# Patient Record
Sex: Male | Born: 1965 | Race: White | Hispanic: No | Marital: Married | Smoking: Never smoker
Health system: Southern US, Community
[De-identification: ages and names within clinical notes are randomized; demographics above are authoritative.]

## PROBLEM LIST (undated history)

## (undated) DIAGNOSIS — E785 Hyperlipidemia, unspecified: Secondary | ICD-10-CM

## (undated) DIAGNOSIS — I10 Essential (primary) hypertension: Secondary | ICD-10-CM

## (undated) DIAGNOSIS — N200 Calculus of kidney: Secondary | ICD-10-CM

## (undated) HISTORY — PX: KIDNEY STONE SURGERY: SHX686

## (undated) HISTORY — PX: VASECTOMY: SHX75

## (undated) HISTORY — DX: Hyperlipidemia, unspecified: E78.5

## (undated) HISTORY — DX: Essential (primary) hypertension: I10

---

## 2003-04-25 ENCOUNTER — Ambulatory Visit (HOSPITAL_COMMUNITY): Admission: RE | Admit: 2003-04-25 | Discharge: 2003-04-25 | Payer: Self-pay | Admitting: Family Medicine

## 2003-04-25 ENCOUNTER — Encounter: Payer: Self-pay | Admitting: Family Medicine

## 2003-05-17 ENCOUNTER — Ambulatory Visit (HOSPITAL_COMMUNITY): Admission: RE | Admit: 2003-05-17 | Discharge: 2003-05-17 | Payer: Self-pay | Admitting: Family Medicine

## 2003-05-17 ENCOUNTER — Encounter: Payer: Self-pay | Admitting: Family Medicine

## 2008-04-29 ENCOUNTER — Inpatient Hospital Stay (HOSPITAL_COMMUNITY): Admission: EM | Admit: 2008-04-29 | Discharge: 2008-05-02 | Payer: Self-pay | Admitting: Emergency Medicine

## 2009-11-09 ENCOUNTER — Emergency Department (HOSPITAL_COMMUNITY): Admission: EM | Admit: 2009-11-09 | Discharge: 2009-11-09 | Payer: Self-pay | Admitting: Emergency Medicine

## 2010-03-16 ENCOUNTER — Emergency Department (HOSPITAL_COMMUNITY): Admission: EM | Admit: 2010-03-16 | Discharge: 2010-03-17 | Payer: Self-pay | Admitting: Emergency Medicine

## 2010-03-17 ENCOUNTER — Emergency Department (HOSPITAL_COMMUNITY): Admission: EM | Admit: 2010-03-17 | Discharge: 2010-03-17 | Payer: Self-pay | Admitting: Emergency Medicine

## 2010-03-18 ENCOUNTER — Ambulatory Visit (HOSPITAL_BASED_OUTPATIENT_CLINIC_OR_DEPARTMENT_OTHER): Admission: RE | Admit: 2010-03-18 | Discharge: 2010-03-18 | Payer: Self-pay | Admitting: Urology

## 2011-03-04 LAB — URINALYSIS, ROUTINE W REFLEX MICROSCOPIC
Bilirubin Urine: NEGATIVE
Glucose, UA: 100 mg/dL — AB
Ketones, ur: NEGATIVE mg/dL
Nitrite: NEGATIVE
Specific Gravity, Urine: 1.02 (ref 1.005–1.030)
Urobilinogen, UA: 1 mg/dL (ref 0.0–1.0)
Urobilinogen, UA: 1 mg/dL (ref 0.0–1.0)
pH: 7 (ref 5.0–8.0)

## 2011-03-04 LAB — POCT HEMOGLOBIN-HEMACUE: Hemoglobin: 15 g/dL (ref 13.0–17.0)

## 2011-03-04 LAB — URINE MICROSCOPIC-ADD ON

## 2011-03-18 LAB — URINALYSIS, ROUTINE W REFLEX MICROSCOPIC
Glucose, UA: NEGATIVE mg/dL
Specific Gravity, Urine: <1.005 — ABNORMAL LOW (ref 1.005–1.030)
Urobilinogen, UA: 0.2 mg/dL (ref 0.0–1.0)
pH: 6 (ref 5.0–8.0)

## 2011-03-18 LAB — URINE MICROSCOPIC-ADD ON

## 2011-04-28 NOTE — Group Therapy Note (Signed)
NAME:  Howard Conner, Howard Conner NO.:  [id number]   MEDICAL RECORD NO.:  [id number]          PATIENT TYPE:  INP   LOCATION:  A339                          FACILITY:  [facility]   PHYSICIAN:  Skeet Latch, DO    DATE OF BIRTH:  02-02-1966   DATE OF PROCEDURE:  05/01/2008  DATE OF DISCHARGE:                                 PROGRESS NOTE   SUBJECTIVE:  [name] is a 45 year old Caucasian male who presented  with a 2-week history of allergy-type symptoms with itchy eyes and cough  that progressed into shortness of breath.  His primary care physician  gave him allergy pills but did not help.  The patient then returned and  was treated with antibiotics.  The patient states that this did not  relieve his symptoms.  He was also given an inhaler but this also did  not relieve his symptoms.  The patient states that his breathing got  progressively worse and he decided to come to the emergency room to be  evaluated.  The patient was seen here and received multiple breathing  treatments and p.o. steroids in the emergency room that did not relieve  his symptoms.  He was admitted for acute exacerbation of asthma and  placed on IV steroids and p.o. antibiotics.  Today the patient states he  is doing much better.  He will be weaned off high-dose steroids starting  tomorrow.  Overall he states that he is improving.   OBJECTIVE:  VITAL SIGNS:  Temperature is 98.0, pulse 82, respirations  20, blood pressure 132/75.  CARDIOVASCULAR:  Regular rate and rhythm.  No murmurs, rubs or gallops.  His lung seem more clear.  Slight end-expiratory wheezing, no rhonchi,  no rales.  ABDOMEN:  Soft, nontender, nondistended. No rigidity or guarding.  Positive bowel sounds.  EXTREMITIES:  No clubbing, cyanosis, or edema.   Labs:  Respiratory culture did show a few gram-negative rods, few gram-  positive cocci in pairs and clusters.  His white count is 20,100,  hemoglobin 13.9, hematocrit 39.0, platelet  count of 280.  He has a  phosphorus of 2.5.  Magnesium of 2.3.  His ABG performed on Apr 29, 2008, showed a pO2 of 59.3 and a bicarb of 25.8.   ASSESSMENT AND PLAN:  1. Acute exacerbation of asthma.  The patient will be weaned from high-      dose steroids starting tomorrow.  He could probably be switched      over to p.o. steroids late tomorrow evening.  He probably needs to      be continued on his p.o. Levaquin upon discharge.  Also probably      continue nebulizer treatments until he is discharged.  The patient      will probably need a PFT as an outpatient.  We will defer to Dr.      Phillips Odor for this.  2. The patient probably wants to home tomorrow.  This is depending on      how his weaning to lower-dose steroids is doing.  Anticipate the      patient  being discharged probably tomorrow evening or early      Thursday morning.      Skeet Latch, DO  Electronically Signed     SM/MEDQ  D:  05/01/2008  T:  05/01/2008  Job:  [id number]

## 2011-04-28 NOTE — Discharge Summary (Signed)
NAME:  Howard Conner, Howard Conner               ACCOUNT NO.:  [id number]   MEDICAL RECORD NO.:  [id number]          PATIENT TYPE:  INP   LOCATION:  A339                          FACILITY:  [facility]   PHYSICIAN:  Osvaldo Shipper, MD     DATE OF BIRTH:  December 17, 1965   DATE OF ADMISSION:  04/29/2008  DATE OF DISCHARGE:  05/20/2009LH                               DISCHARGE SUMMARY   Please review H&P dictated by Dr. Lilian Kapur for details regarding the  patient's presenting illness.   PMD:  Dr. Assunta Found with [facility].   DISCHARGE DIAGNOSES:  1. Likely acute asthma exacerbation.  2. History of seasonal allergies.  3. Two episodes of emesis unclear etiology.   BRIEF HOSPITAL COURSE:  Briefly, the patient is a 45 year old Caucasian  male who has really no other no medical history.  He does have a history  of seasonal allergies for which he takes as needed Allegra.  He also is  not a smoker.  He presented after a few weeks history of allergy-type  symptoms, and then about a few days prior to admission he started  feeling short of breath.  Went to his PMD's office and was treated with  antibiotics, felt no better, and decided to come into the emergency  department.  The patient did not feel better after he was treated in the  ED and he was admitted for further evaluation.  When he presented he did  have some wheezing bilaterally.  His chest x-ray did not show any focal  abnormality, bronchitic changes were noted.  The patient was treated  with nebulizer treatments, steroids.  He has improved the last couple of  days.  This morning his lungs are very clear.  He feels no discomfort  whatsoever.  He does admit to having a couple episodes of emesis  yesterday, but he has tolerated his breakfast today and he denies any  nausea or any abdominal pain.  At this point, the patient is stable for  discharge.   EXAM:  His vital signs are all stable.  Examination is unremarkable.  LUNGS: Clear to  auscultation.  No wheezing was present.   At this point I think the patient can be discharged just on p.r.n.  albuterol.  I will also write him prescriptions for steroid taper.  PFTs  will be arranged in 2 weeks.  However, the patient will be asked to  follow up with his PMD in one week's time.  This patient may be a  candidate for inhaled steroids, but I would defer to the PMD to initiate  that.  It will be prudent to wait until we have results from PFT, but if  the patient's symptoms recur, then that may be a sign that he does need  inhaled steroids.   Also an outpatient consultation with a pulmonologist may have to be  considered.   DISCHARGE MEDICATIONS:  1. Prednisone 60 mg daily for 3 days, 40 daily for 3 days, and then 20      daily for 3 days, and then stop.  2. Albuterol MDI 2 puffs  inhaled every 6 hours as needed.  3. Allegra 180 mg p.o. daily.   Follow up with Dr. Phillips Odor in 1 week and for pulmonary function tests in  2 weeks.   PMD to decide referral to pulmonologist if needed.   DIET:  No restrictions.   PHYSICAL ACTIVITY:  No restrictions.   Total time on this discharge encounter about 35 minutes.      Osvaldo Shipper, MD  Electronically Signed     GK/MEDQ  D:  05/02/2008  T:  05/02/2008  Job:  [id number]   cc:   [name], M.D.  Fax: [phone]

## 2011-04-28 NOTE — H&P (Signed)
NAME:  Howard Conner, Howard Conner               ACCOUNT NO.:  [id number]   MEDICAL RECORD NO.:  [id number]          PATIENT TYPE:  OBV   LOCATION:  A339                          FACILITY:  [facility]   PHYSICIAN:  Skeet Latch, DO    DATE OF BIRTH:  [birth year]   DATE OF ADMISSION:  04/29/2008  DATE OF DISCHARGE:  LH                              HISTORY & PHYSICAL   PRIMARY CARE PHYSICIAN:  Dr. Phillips Odor.   CHIEF COMPLAINT:  Shortness of breath.   HISTORY OF PRESENT ILLNESS:  This is a 45 year old Caucasian male who  was in his usual state of health until approximately 2 weeks ago when he  started getting allergy-type symptoms with sneezing, itching eyes, and  cough.  The patient states after those symptoms, he began to have some  severe coughing with some shortness of breath.  He went to his primary  care physician's office, was given I  believe allergy pills which did  not help.  He went back to his primary care physician's office and began  to be treated with antibiotics for bronchitis.  He has been on  antibiotics and I believe inhaler for the last 2 weeks and states that  he ran out of the medication 2 days ago and states that his breathing  has worsened, and he is severely short of breath.  The patient now  presents with shortness of breath and cough.  The patient was seen in  the emergency room, was given p.o. steroids and multiple breathing  treatments without relief, and now it is felt  the patient needs to be  admitted for possible asthma exacerbation.   PAST MEDICAL HISTORY:  Is unremarkable.   MEDICATIONS:  None.   DRUG ALLERGIES:  None.   PAST SURGICAL HISTORY:  None.   SOCIAL HISTORY:  Denies any tobacco, alcohol or illicit drug use.  The  patient works at [employer].   FAMILY HISTORY:  Is positive for cancer, lymphoma.   REVIEW OF SYSTEMS:  Unremarkable except for respiratory with shortness  of breath and cough.   PHYSICAL EXAMINATION:  VITAL SIGNS:  Temperature is 98.2,  pulse 101,  respirations 20, blood pressure 133/87, saturating 95%.  GENERAL:  He is awake and alert.  He does have conversational dyspnea on  exam. He is well nourished, well hydrated, well developed, no acute  distress.  CARDIOVASCULAR:  Slightly tachycardiac.  No rubs, gallops or murmurs.  LUNGS:  Decreased breath sounds with decreased effort, poor air exchange  noted.  He has some wheezing bilateral bases posteriorly.  No rhonchi  appreciated.  ABDOMEN:  Soft, nontender, nondistended.  No rigidity or guarding.  Positive bowel sounds.  EXTREMITIES:  No clubbing, cyanosis or edema.  HEENT:  Head is atraumatic, normocephalic.  PERRLA.  EOMI.  No scleral  icterus is noted.  NECK:  Soft, supple, nontender, nondistended.  NEUROLOGIC:  Cranial nerves II-XII are grossly intact.  Patient alert  and oriented x3.   LABORATORY DATA:  ABG showed a pH of 7.393, pCO2 of 43.3, pO2 of 59.3,  bicarb 25.8.   Chest x-ray showed  bronchitic change.  No focal pulmonary abnormalities   ASSESSMENT AND PLAN:  Asthma exacerbation.  For this, the patient will  be admitted to the service of [facility], general medical bed.  The  patient will receive nebulized treatments every 4 hours and as needed.  The patient will be on IV steroids also.  Will empirically place the  patient  on p.o. antibiotics and get sputum cultures and sensitivities.  Will try to keep the patient's O2 saturation above or greater than 90%.  The patient will be on IV fluids at a rate of 125 mL per hour.  The  patient will be on IV antiemetics as well as pain medications as needed.  The patient will also will also be on GI prophylaxis with proton pump  inhibitor.  If the patient does not improve within the next 24 hours,  may need to consult pulmonology for some recommendations.      Skeet Latch, DO  Electronically Signed     SM/MEDQ  D:  04/29/2008  T:  04/29/2008  Job:  [id number]   cc:   Dr.  Phillips Odor

## 2011-09-09 LAB — MAGNESIUM: Magnesium: 2.3

## 2011-09-09 LAB — BASIC METABOLIC PANEL
BUN: 11
CO2: 28
Calcium: 8.8
Chloride: 108
GFR calc Af Amer: >60
GFR calc non Af Amer: >60
Glucose, Bld: 190 — ABNORMAL HIGH
Potassium: 4
Sodium: 139

## 2011-09-09 LAB — CBC
HCT: 41.4
Hemoglobin: 13.9
MCHC: 35.7
MCV: 85.2
Platelets: 254
Platelets: 280
RBC: 4.5
RDW: 12.4
WBC: 20.1 — ABNORMAL HIGH

## 2011-09-09 LAB — BLOOD GAS, ARTERIAL
Bicarbonate: 25.8 — ABNORMAL HIGH
FIO2: 21
O2 Saturation: 90.3
Patient temperature: 37

## 2011-09-09 LAB — DIFFERENTIAL
Basophils Absolute: 0
Basophils Relative: 0
Eosinophils Absolute: 0
Eosinophils Absolute: 0
Eosinophils Relative: 0
Lymphocytes Relative: 5 — ABNORMAL LOW
Lymphocytes Relative: 6 — ABNORMAL LOW
Lymphs Abs: 0.9
Lymphs Abs: 1.1
Monocytes Absolute: 0.7
Monocytes Relative: 4
Neutro Abs: 15.7 — ABNORMAL HIGH
Neutrophils Relative %: 92 — ABNORMAL HIGH

## 2011-09-09 LAB — CULTURE, RESPIRATORY W GRAM STAIN: Culture: NORMAL

## 2014-06-19 ENCOUNTER — Encounter (HOSPITAL_COMMUNITY): Payer: Self-pay | Admitting: Emergency Medicine

## 2014-06-19 ENCOUNTER — Emergency Department (HOSPITAL_COMMUNITY)
Admission: EM | Admit: 2014-06-19 | Discharge: 2014-06-19 | Disposition: A | Discharge status: Home / Self Care | Payer: No Typology Code available for payment source | Attending: Emergency Medicine | Admitting: Emergency Medicine

## 2014-06-19 DIAGNOSIS — L03019 Cellulitis of unspecified finger: Principal | ICD-10-CM

## 2014-06-19 DIAGNOSIS — L03012 Cellulitis of left finger: Secondary | ICD-10-CM

## 2014-06-19 DIAGNOSIS — L02519 Cutaneous abscess of unspecified hand: Secondary | ICD-10-CM | POA: Insufficient documentation

## 2014-06-19 DIAGNOSIS — Z87442 Personal history of urinary calculi: Secondary | ICD-10-CM | POA: Insufficient documentation

## 2014-06-19 HISTORY — DX: Calculus of kidney: N20.0

## 2014-06-19 MED ORDER — LIDOCAINE HCL 1 % IJ SOLN: 5.0000 mL | Freq: Once | INTRAMUSCULAR | Status: DC

## 2014-06-19 MED ORDER — SULFAMETHOXAZOLE-TMP DS 800-160 MG PO TABS
1.0000 | ORAL_TABLET | Freq: Once | ORAL | Status: AC
  Administered 2014-06-19: 1 via ORAL
  Filled 2014-06-19: qty 1

## 2014-06-19 NOTE — ED Notes (Signed)
Pt noticed spot on left middle finger on Saturday, states every since then hand has been getting worse, painful, red and swollen. Pt went to PCP was but on Septra and give a prednisone shot on Monday.

## 2014-06-19 NOTE — Discharge Instructions (Signed)
Cellulitis Cellulitis is an infection of the skin and the tissue beneath it. The infected area is usually red and tender. Cellulitis occurs most often in the arms and lower legs.  CAUSES  Cellulitis is caused by bacteria that enter the skin through cracks or cuts in the skin. The most common types of bacteria that cause cellulitis are Staphylococcus and Streptococcus. SYMPTOMS   Redness and warmth.  Swelling.  Tenderness or pain.  Fever. DIAGNOSIS  Your caregiver can usually determine what is wrong based on a physical exam. Blood tests may also be done. TREATMENT  Treatment usually involves taking an antibiotic medicine. HOME CARE INSTRUCTIONS   Take your antibiotics as directed. Finish them even if you start to feel better.  Keep the infected arm or leg elevated to reduce swelling.  Apply a warm cloth to the affected area up to 4 times per day to relieve pain.  Only take over-the-counter or prescription medicines for pain, discomfort, or fever as directed by your caregiver.  Keep all follow-up appointments as directed by your caregiver. SEEK MEDICAL CARE IF:   You notice red streaks coming from the infected area.  Your red area gets larger or turns dark in color.  Your bone or joint underneath the infected area becomes painful after the skin has healed.  Your infection returns in the same area or another area.  You notice a swollen bump in the infected area.  You develop new symptoms. SEEK IMMEDIATE MEDICAL CARE IF:   You have a fever.  You feel very sleepy.  You develop vomiting or diarrhea.  You have a general ill feeling (malaise) with muscle aches and pains. MAKE SURE YOU:   Understand these instructions.  Will watch your condition.  Will get help right away if you are not doing well or get worse. Document Released: 09/09/2005 Document Revised: 05/31/2012 Document Reviewed: 02/15/2012 Exit[employer] Patient Information 2015 [employer]. This information is  not intended to replace advice given to you by your health care provider. Make sure you discuss any questions you have with your health care provider.  

## 2014-06-19 NOTE — ED Provider Notes (Signed)
Medical screening examination/treatment/procedure(s) were conducted as a shared visit with non-physician practitioner(s) and myself.  I personally evaluated the patient during the encounter.  Small pustule noted on dorsal aspect left middle finger (overlying PIP).  Full passive ROM.   Doubt tenosynovitis or severe deep space infection of hand. Do not feel that IV abx  And admission are necessary at this time.  Discussed importance of taking his antibiotics.  Will need close follow up.  24 hours.    Dorie Rank, MD 06/19/14 [phone]

## 2014-06-19 NOTE — ED Provider Notes (Signed)
CSN: [id number]     Arrival date & time 06/19/14  1452 History  This chart was scribed for non-physician practitioner Howard Conner, working with Howard Rank, MD by Howard Conner, ED Scribe. This patient was seen in room WTR7/WTR7 and the patient's care was started at 3:54 PM.    Chief Complaint  Patient presents with  . Hand Pain    Patient is a 48 y.o. male presenting with hand pain. The history is provided by the patient. No language interpreter was used.  Hand Pain   HPI Comments: Howard Conner is a 48 y.o. male who presents to the Emergency Department complaining of a painful, erythematous "spot" on his left middle finger that he noticed three days ago.  The patient states that he was able to drain some pus from the spot and two days ago he noticed increased swelling and pain.  The patient states that he went to his PCP yesterday and was given a shot of prednisone and a prescription for Septra and Bactrim.  He states that he did not fill the prescription for the Septra.  He states that this morning the pain, swelling, and erythema has worsened.  He states that he had a low-grade fever of 99 degrees yesterday.  He states that he is right hand dominant.  He denies having a history of DM.   Past Medical History  Diagnosis Date  . Kidney stone    History reviewed. No pertinent past surgical history. No family history on file. History  Substance Use Topics  . Smoking status: Never Smoker   . Smokeless tobacco: Not on file  . Alcohol Use: No    Review of Systems  Musculoskeletal: Positive for joint swelling.  Skin: Positive for color change and wound.  All other systems reviewed and are negative.     Allergies  Review of patient's allergies indicates no known allergies.  Home Medications   Prior to Admission medications   Medication Sig Start Date End Date Taking? Authorizing Provider  ibuprofen (ADVIL,MOTRIN) 200 MG tablet Take 600 mg by mouth every 6 (six) hours as needed  for moderate pain.   Yes Historical Provider, MD  PRESCRIPTION MEDICATION    Yes Historical Provider, MD   Triage Vitals: BP 155/109  Pulse 94  Temp(Src) 98.5 F (36.9 C) (Oral)  Resp 20  SpO2 94%  Physical Exam  Nursing note and vitals reviewed. Constitutional: He is oriented to person, place, and time. He appears well-developed and well-nourished. No distress.  HENT:  Head: Normocephalic and atraumatic.  Right Ear: External ear normal.  Left Ear: External ear normal.  Nose: Nose normal.  Eyes: Conjunctivae and EOM are normal.  Neck: Normal range of motion. No tracheal deviation present.  Cardiovascular: Normal rate, regular rhythm and normal heart sounds.   Pulmonary/Chest: Effort normal and breath sounds normal. No stridor.  Abdominal: Soft. He exhibits no distension. There is no tenderness.  Musculoskeletal: Normal range of motion.  Full rom of left middle finger. Compartment soft.   Neurological: He is alert and oriented to person, place, and time.  Skin: Skin is warm and dry. He is not diaphoretic. There is erythema.  2 cm of erythema with central pustule on dorsal aspect of left third finger.    Psychiatric: He has a normal mood and affect. His behavior is normal.    ED Course  Procedures (including critical care time)  DIAGNOSTIC STUDIES: Oxygen Saturation is 94% on room air, adequate by my interpretation.  COORDINATION OF CARE: 3:57 PM- Discussed consulting the attending physician to determine whether or not to start the patient on IV antibiotics or drain the wound.  The patient agreed to the treatment plan.   INCISION AND DRAINAGE Performed by: Howard Conner Consent: Verbal consent obtained. Risks and benefits: risks, benefits and alternatives were discussed Type: abscess  Body area: left middle finger  Anesthesia: digital block  Incision was made with a scalpel.  Local anesthetic: lidocaine 2%  Anesthetic total: 3 ml  Complexity: complex Blunt  dissection to break up loculations  Drainage: bloody  Drainage amount: moderate  Patient tolerance: Patient tolerated the procedure well with no immediate complications.     Labs Review Labs Reviewed - No data to display  Imaging Review No results found.   EKG Interpretation None      MDM   Final diagnoses:  Cellulitis of finger of left hand    Patient presents to ED with complaint of cellulitis of left middle finger. Seen by PCP and given septra, although this rx was not filled. I&D done in ED, drainage mostly bloody. Reassured patient that Bactrim was the appropriate antibiotic. He was given his first dose in ED. Patient was given hand surgery referral. Encouraged to see either PCP or return to ED in 1 day. Discussed reasons to return to ED immediately. Vital signs stable for discharge. Dr. Tomi Conner evaluated patient and agrees with plan. Patient / Family / Caregiver informed of clinical course, understand medical decision-making process, and agree with plan.    I personally performed the services described in this documentation, which was scribed in my presence. The recorded information has been reviewed and is accurate.     Howard Lade, PA-C 06/27/14 [place], PA-C 06/27/14 1314

## 2014-06-28 NOTE — ED Provider Notes (Signed)
Medical screening examination/treatment/procedure(s) were performed by non-physician practitioner and as supervising physician I was immediately available for consultation/collaboration.    Dorie Rank, MD 06/28/14 [phone]

## 2015-09-14 ENCOUNTER — Emergency Department (HOSPITAL_COMMUNITY)
Admission: EM | Admit: 2015-09-14 | Discharge: 2015-09-14 | Disposition: A | Discharge status: Home / Self Care | Payer: No Typology Code available for payment source | Attending: Emergency Medicine | Admitting: Emergency Medicine

## 2015-09-14 ENCOUNTER — Emergency Department (HOSPITAL_COMMUNITY): Payer: No Typology Code available for payment source

## 2015-09-14 ENCOUNTER — Encounter (HOSPITAL_COMMUNITY): Payer: Self-pay | Admitting: Emergency Medicine

## 2015-09-14 DIAGNOSIS — N200 Calculus of kidney: Secondary | ICD-10-CM | POA: Insufficient documentation

## 2015-09-14 DIAGNOSIS — R109 Unspecified abdominal pain: Secondary | ICD-10-CM | POA: Diagnosis present

## 2015-09-14 LAB — URINALYSIS, ROUTINE W REFLEX MICROSCOPIC
BILIRUBIN URINE: NEGATIVE
Glucose, UA: NEGATIVE mg/dL
Ketones, ur: NEGATIVE mg/dL
Leukocytes, UA: NEGATIVE
NITRITE: NEGATIVE
Protein, ur: NEGATIVE mg/dL
SPECIFIC GRAVITY, URINE: 1.015 (ref 1.005–1.030)
UROBILINOGEN UA: 0.2 mg/dL (ref 0.0–1.0)
pH: 7.5 (ref 5.0–8.0)

## 2015-09-14 LAB — URINE MICROSCOPIC-ADD ON

## 2015-09-14 MED ORDER — ONDANSETRON 8 MG PO TBDP: 8.0000 mg | ORAL_TABLET | Freq: Three times a day (TID) | ORAL | Status: DC | PRN

## 2015-09-14 MED ORDER — SODIUM CHLORIDE 0.9 % IV SOLN
INTRAVENOUS | Status: DC
  Administered 2015-09-14: 20 mL/h via INTRAVENOUS

## 2015-09-14 MED ORDER — KETOROLAC TROMETHAMINE 30 MG/ML IJ SOLN
30.0000 mg | Freq: Once | INTRAMUSCULAR | Status: AC
  Administered 2015-09-14: 30 mg via INTRAVENOUS
  Filled 2015-09-14: qty 1

## 2015-09-14 MED ORDER — OXYCODONE-ACETAMINOPHEN 5-325 MG PO TABS
2.0000 | ORAL_TABLET | ORAL | Status: DC | PRN
Start: 2015-09-14 — End: 2017-01-20

## 2015-09-14 MED ORDER — ONDANSETRON HCL 4 MG/2ML IJ SOLN
4.0000 mg | Freq: Once | INTRAMUSCULAR | Status: AC
  Administered 2015-09-14: 4 mg via INTRAVENOUS
  Filled 2015-09-14: qty 2

## 2015-09-14 MED ORDER — HYDROMORPHONE HCL 1 MG/ML IJ SOLN
1.0000 mg | Freq: Once | INTRAMUSCULAR | Status: AC
  Administered 2015-09-14: 1 mg via INTRAVENOUS
  Filled 2015-09-14: qty 1

## 2015-09-14 NOTE — ED Notes (Signed)
Pt from home c/o left lower back pain/flank pain since about midnight. Hx of kidney stones. Pt experiencing vomiting at home and unbale to keep down pain medications.

## 2015-09-14 NOTE — ED Provider Notes (Signed)
CSN: [id number]     Arrival date & time 09/14/15  0236 History  By signing my name below, I, Irene Pap, attest that this documentation has been prepared under the direction and in the presence of Lacretia Leigh, MD. Electronically Signed: Irene Pap, ED Scribe. 09/14/2015. 2:49 AM.   Chief Complaint  Patient presents with  . Flank Pain   The history is provided by the patient. No language interpreter was used.   HPI Comments: Howard Conner is a 49 y.o. Male with a hx of left kidney stone x1 who presents to the Emergency Department complaining of left flank pain onset 3 hours ago. Pt states this feels like his past kidney stone pain. Pt took oxycodone 325 mg to no relief because he continued to vomit up the medication. Wife states that pt was unable to pass previous stone and surgery was performed. Pt denies hematuria.   Past Medical History  Diagnosis Date  . Kidney stone    History reviewed. No pertinent past surgical history. No family history on file. Social History  Substance Use Topics  . Smoking status: Never Smoker   . Smokeless tobacco: None  . Alcohol Use: No    Review of Systems  Gastrointestinal: Positive for vomiting.  Genitourinary: Positive for flank pain. Negative for hematuria.  All other systems reviewed and are negative.  Allergies  Review of patient's allergies indicates no known allergies.  Home Medications   Prior to Admission medications   Medication Sig Start Date End Date Taking? Authorizing Provider  ibuprofen (ADVIL,MOTRIN) 200 MG tablet Take 600 mg by mouth every 6 (six) hours as needed for moderate pain.    Historical Provider, MD  [place] Provider, MD   BP 153/93 mmHg  Pulse 91  Temp(Src) 98.6 F (37 C) (Oral)  Resp 20  SpO2 98%  Physical Exam  Constitutional: He is oriented to person, place, and time. He appears well-developed and well-nourished.  Non-toxic appearance. No distress.  HENT:  Head:  Normocephalic and atraumatic.  Eyes: Conjunctivae, EOM and lids are normal. Pupils are equal, round, and reactive to light.  Neck: Normal range of motion. Neck supple. No tracheal deviation present. No thyroid mass present.  Cardiovascular: Normal rate, regular rhythm and normal heart sounds.  Exam reveals no gallop.   No murmur heard. Pulmonary/Chest: Effort normal and breath sounds normal. No stridor. No respiratory distress. He has no decreased breath sounds. He has no wheezes. He has no rhonchi. He has no rales.  Abdominal: Soft. Normal appearance and bowel sounds are normal. He exhibits no distension. There is no tenderness. There is no rebound and no CVA tenderness.  Musculoskeletal: Normal range of motion. He exhibits no edema or tenderness.  Neurological: He is alert and oriented to person, place, and time. He has normal strength. No cranial nerve deficit or sensory deficit. GCS eye subscore is 4. GCS verbal subscore is 5. GCS motor subscore is 6.  Skin: Skin is warm and dry. No abrasion and no rash noted.  Psychiatric: He has a normal mood and affect. His speech is normal and behavior is normal.  Nursing note and vitals reviewed.   ED Course  Procedures (including critical care time) DIAGNOSTIC STUDIES: Oxygen Saturation is 98% on RA, normal by my interpretation.    COORDINATION OF CARE: 2:50 AM-Discussed treatment plan which includes labs and CT scan with pt at bedside and pt agreed to plan.   Labs Review Labs Reviewed  URINALYSIS,  ROUTINE W REFLEX MICROSCOPIC (NOT AT [facility])    Imaging Review No results found.    EKG Interpretation None      MDM   Final diagnoses:  None   I personally performed the services described in this documentation, which was scribed in my presence. The recorded information has been reviewed and is accurate.  Patient's pain is controlled this time. Kidney stone appears to have passed. Instructed to follow-up for the renal mass seen on  CT.   Lacretia Leigh, MD 09/14/15 [phone]

## 2015-09-14 NOTE — Discharge Instructions (Signed)
Follow-up with your urologist for the kidney stone. Make sure that either the urologist or your primary care doctor orders the outpatient MRI for the reason as below  Kidney 2. 2 cm left renal lesion which could reflect complicated cyst or carcinoma. Enhanced cross-sectional imaging is recommended non emergently, preferably MRI.  Stones Kidney stones (urolithiasis) are deposits that form inside your kidneys. The intense pain is caused by the stone moving through the urinary tract. When the stone moves, the ureter goes into spasm around the stone. The stone is usually passed in the urine.  CAUSES   A disorder that makes certain neck glands produce too much parathyroid hormone (primary hyperparathyroidism).  A buildup of uric acid crystals, similar to gout in your joints.  Narrowing (stricture) of the ureter.  A kidney obstruction present at birth (congenital obstruction).  Previous surgery on the kidney or ureters.  Numerous kidney infections. SYMPTOMS   Feeling sick to your stomach (nauseous).  Throwing up (vomiting).  Blood in the urine (hematuria).  Pain that usually spreads (radiates) to the groin.  Frequency or urgency of urination. DIAGNOSIS   Taking a history and physical exam.  Blood or urine tests.  CT scan.  Occasionally, an examination of the inside of the urinary bladder (cystoscopy) is performed. TREATMENT   Observation.  Increasing your fluid intake.  Extracorporeal shock wave lithotripsy--This is a noninvasive procedure that uses shock waves to break up kidney stones.  Surgery may be needed if you have severe pain or persistent obstruction. There are various surgical procedures. Most of the procedures are performed with the use of small instruments. Only small incisions are needed to accommodate these instruments, so recovery time is minimized. The size, location, and chemical composition are all important variables that will determine the proper  choice of action for you. Talk to your health care provider to better understand your situation so that you will minimize the risk of injury to yourself and your kidney.  HOME CARE INSTRUCTIONS   Drink enough water and fluids to keep your urine clear or pale yellow. This will help you to pass the stone or stone fragments.  Strain all urine through the provided strainer. Keep all particulate matter and stones for your health care provider to see. The stone causing the pain may be as small as a grain of salt. It is very important to use the strainer each and every time you pass your urine. The collection of your stone will allow your health care provider to analyze it and verify that a stone has actually passed. The stone analysis will often identify what you can do to reduce the incidence of recurrences.  Only take over-the-counter or prescription medicines for pain, discomfort, or fever as directed by your health care provider.  Make a follow-up appointment with your health care provider as directed.  Get follow-up X-rays if required. The absence of pain does not always mean that the stone has passed. It may have only stopped moving. If the urine remains completely obstructed, it can cause loss of kidney function or even complete destruction of the kidney. It is your responsibility to make sure X-rays and follow-ups are completed. Ultrasounds of the kidney can show blockages and the status of the kidney. Ultrasounds are not associated with any radiation and can be performed easily in a matter of minutes. SEEK MEDICAL CARE IF:  You experience pain that is progressive and unresponsive to any pain medicine you have been prescribed. SEEK IMMEDIATE MEDICAL CARE IF:  Pain cannot be controlled with the prescribed medicine.  You have a fever or shaking chills.  The severity or intensity of pain increases over 18 hours and is not relieved by pain medicine.  You develop a new onset of abdominal  pain.  You feel faint or pass out.  You are unable to urinate. MAKE SURE YOU:   Understand these instructions.  Will watch your condition.  Will get help right away if you are not doing well or get worse. Document Released: 11/30/2005 Document Revised: 08/02/2013 Document Reviewed: 05/03/2013 [facility] Patient Information 2015 [place], [place]. This information is not intended to replace advice given to you by your health care provider. Make sure you discuss any questions you have with your health care provider.

## 2015-09-18 ENCOUNTER — Other Ambulatory Visit (HOSPITAL_COMMUNITY): Payer: Self-pay | Admitting: Urology

## 2015-09-18 DIAGNOSIS — D49519 Neoplasm of unspecified behavior of unspecified kidney: Secondary | ICD-10-CM

## 2015-09-26 ENCOUNTER — Ambulatory Visit (HOSPITAL_COMMUNITY)
Admission: RE | Admit: 2015-09-26 | Discharge: 2015-09-26 | Disposition: A | Discharge status: Home / Self Care | Payer: No Typology Code available for payment source | Source: Ambulatory Visit | Attending: Urology | Admitting: Urology

## 2015-09-26 DIAGNOSIS — D49519 Neoplasm of unspecified behavior of unspecified kidney: Secondary | ICD-10-CM

## 2015-09-26 DIAGNOSIS — N281 Cyst of kidney, acquired: Secondary | ICD-10-CM | POA: Diagnosis not present

## 2015-09-26 DIAGNOSIS — N2889 Other specified disorders of kidney and ureter: Secondary | ICD-10-CM | POA: Insufficient documentation

## 2015-09-26 DIAGNOSIS — K760 Fatty (change of) liver, not elsewhere classified: Secondary | ICD-10-CM | POA: Diagnosis not present

## 2015-09-26 MED ORDER — GADOBENATE DIMEGLUMINE 529 MG/ML IV SOLN
20.0000 mL | Freq: Once | INTRAVENOUS | Status: AC | PRN
  Administered 2015-09-26: 18 mL via INTRAVENOUS

## 2016-12-11 ENCOUNTER — Ambulatory Visit (HOSPITAL_COMMUNITY)
Admission: RE | Admit: 2016-12-11 | Discharge: 2016-12-11 | Disposition: A | Discharge status: Home / Self Care | Payer: No Typology Code available for payment source | Source: Ambulatory Visit | Attending: Family Medicine | Admitting: Family Medicine

## 2016-12-11 ENCOUNTER — Other Ambulatory Visit (HOSPITAL_COMMUNITY): Payer: Self-pay | Admitting: Family Medicine

## 2016-12-11 DIAGNOSIS — R0789 Other chest pain: Secondary | ICD-10-CM

## 2017-01-20 ENCOUNTER — Ambulatory Visit (INDEPENDENT_AMBULATORY_CARE_PROVIDER_SITE_OTHER): Payer: No Typology Code available for payment source | Admitting: Physician Assistant

## 2017-01-20 ENCOUNTER — Ambulatory Visit: Payer: No Typology Code available for payment source | Admitting: Cardiovascular Disease

## 2017-01-20 ENCOUNTER — Encounter: Payer: Self-pay | Admitting: Physician Assistant

## 2017-01-20 VITALS — BP 120/78 | HR 80 | Ht 68.0 in | Wt 206.0 lb

## 2017-01-20 DIAGNOSIS — E785 Hyperlipidemia, unspecified: Secondary | ICD-10-CM | POA: Diagnosis not present

## 2017-01-20 DIAGNOSIS — I10 Essential (primary) hypertension: Secondary | ICD-10-CM | POA: Insufficient documentation

## 2017-01-20 DIAGNOSIS — R079 Chest pain, unspecified: Secondary | ICD-10-CM | POA: Diagnosis not present

## 2017-01-20 NOTE — Progress Notes (Signed)
Cardiology Office Note    Date:  01/20/2017   ID:  ANTWION HAYDU, DOB [birth year], MRN [id number]  PCP:  Purvis Kilts, MD  Cardiologist: new  Chief Complaint  Patient presents with  . Chest Pain    History of Present Illness:  LARAMY TREICHEL is a 51 y.o. male  with history of hypertension, HLD and obesity referred to [place] from primary care for evaluation of chest pain.EKG at Dr. Cornelia Copa office is normal, chest x-ray normal.  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 As soon as he sits up the pain goes away. 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 He works in yard and is quite active without any chest pain. He denies any chest pressure, tightness, dyspnea, dyspnea on exertion, palpitations, dizziness or presyncope. He has no pain on deep inspiration. He denies shortness of breath of any sort. He doesn't remember straining himself but did drag a deer while hunting in December.    Past Medical History:  Diagnosis Date  . Hyperlipidemia   . Hypertension   . Kidney stone     Past Surgical History:  Procedure Laterality Date  . KIDNEY STONE SURGERY    . VASECTOMY      Current Medications: Outpatient Medications Prior to Visit  Medication Sig Dispense Refill  . albuterol (PROVENTIL HFA;VENTOLIN HFA) 108 (90 BASE) MCG/ACT inhaler Inhale 1-2 puffs into the lungs every 6 (six) hours as needed for shortness of breath.    Marland Kitchen ibuprofen (ADVIL,MOTRIN) 200 MG tablet Take 400 mg by mouth every 6 (six) hours as needed for moderate pain.     Marland Kitchen ondansetron (ZOFRAN ODT) 8 MG disintegrating tablet Take 1 tablet (8 mg total) by mouth every 8 (eight) hours as needed for nausea or vomiting. 20 tablet 0  . oxyCODONE-acetaminophen (PERCOCET/ROXICET) 5-325 MG tablet Take 2 tablets by mouth every 4 (four) hours as  needed for severe pain. 15 tablet 0   No facility-administered medications prior to visit.      Allergies:   Patient has no known allergies.   Social History   Social History  . Marital status: Married    Spouse name: N/A  . Number of children: N/A  . Years of education: N/A   Social History Main Topics  . Smoking status: Never Smoker  . Smokeless tobacco: Never Used  . Alcohol use No  . Drug use: No  . Sexual activity: Not Asked   Other Topics Concern  . None   Social History Narrative  . None     Family History:  The patient's family history includes CAD in his maternal grandfather and paternal grandfather; Lymphoma in his father.   ROS:   Please see the history of present illness.    Review of Systems  Constitution: Negative.  HENT: Negative.   Cardiovascular: Positive for chest pain.  Respiratory: Negative.   Endocrine: Negative.   Hematologic/Lymphatic: Negative.   Musculoskeletal: Negative.   Gastrointestinal: Negative.   Genitourinary: Negative.   Neurological: Negative.    All other systems reviewed and are negative.   PHYSICAL EXAM:   VS:  BP 120/78 (BP Location: Left Arm)   Pulse 80   Ht 5\' 8"  (1.727 m)   Wt 206 lb (93.4 kg)   SpO2  98%   BMI 31.32 kg/m   Physical Exam  GEN: Well nourished, well developed, in no acute distress  Neck: no JVD, carotid bruits, or masses Cardiac:RRR; no murmurs, rubs, or gallops  Respiratory:  clear to auscultation bilaterally, normal work of breathing GI: soft, nontender, nondistended, + BS Ext: without cyanosis, clubbing, or edema, Good distal pulses bilaterally MS: no deformity or atrophy  Skin: warm and dry, no rash Neuro:  Alert and Oriented x 3, Strength and sensation are intact Psych: euthymic mood, full affect  Wt Readings from Last 3 Encounters:  01/20/17 206 lb (93.4 kg)  09/26/15 200 lb (90.7 kg)      Studies/Labs Reviewed:   EKG:  EKG is not ordered today.  EKG reviewed from Dr. Cornelia Copa  office and shows normal sinus rhythm, no acute change. Doses dated 12/16/16  Recent Labs: No results found for requested labs within last 8760 hours.   Lipid Panel No results found for: CHOL, TRIG, HDL, CHOLHDL, VLDL, LDLCALC, LDLDIRECT  Additional studies/ records that were reviewed today include:  Chest x-ray 12/11/16 no active cardiopulmonary disease    ASSESSMENT:    1. Chest pain, unspecified type   2. Essential hypertension   3. Hyperlipidemia, unspecified hyperlipidemia type      PLAN:  In order of problems listed above:  Chest pain atypical and sounds musculoskeletal. He denies any chest tightness or pressure. He has not tried ibuprofen recommended by his primary care. Recommend he try 400 mg 3 times a day for for 5 days to see if this helps. If it doesn't then we will proceed with a GXT. Discussed with Dr. Harrington Challenger  Essential hypertension controlled on amlodipine  Hyperlipidemia controlled with diet and fish oil    Medication Adjustments/Labs and Tests Ordered: Current medicines are reviewed at length with the patient today.  Concerns regarding medicines are outlined above.  Medication changes, Labs and Tests ordered today are listed in the Patient Instructions below. Patient Instructions  Your physician recommends that you schedule a follow-up appointment in: 1 Month   Your physician recommends that you continue on your current medications as directed. Please refer to the Current Medication list given to you today.  If you need a refill on your cardiac medications before your next appointment, please call your pharmacy.  Thank you for choosing [facility]!       Signed, Ermalinda Barrios, PA-C  01/20/2017 1:17 PM    [facility] [address], [place], [place]  [address] Phone: [phone]; Fax: [phone]

## 2017-01-20 NOTE — Patient Instructions (Signed)
Your physician recommends that you schedule a follow-up appointment in: 1 Month  Your physician recommends that you continue on your current medications as directed. Please refer to the Current Medication list given to you today.  If you need a refill on your cardiac medications before your next appointment, please call your pharmacy.  Thank you for choosing [facility]!    

## 2017-02-18 ENCOUNTER — Ambulatory Visit: Payer: No Typology Code available for payment source | Admitting: Cardiology

## 2018-11-18 IMAGING — DX DG CHEST 2V
2 series · 2 of 2 positions shown · non-contrast
Comparison: 04/29/2008

CLINICAL DATA: CHEST PAIN, PATIENT STATES " HE RECENTLY HAS BEEN
HAVING CHEST PAIN AND PRIMARY DOCTOR THINKS IT HAS SOMETHING TO DO
WITH INFLAMMATION NEAR STERNUM AND RIB AREA MEETS, WASN'T A X-RAY TO
CONFIRM " NO OTHER HISTORY DOCUMENTED

EXAM:
CHEST  2 VIEW

[chest pa]
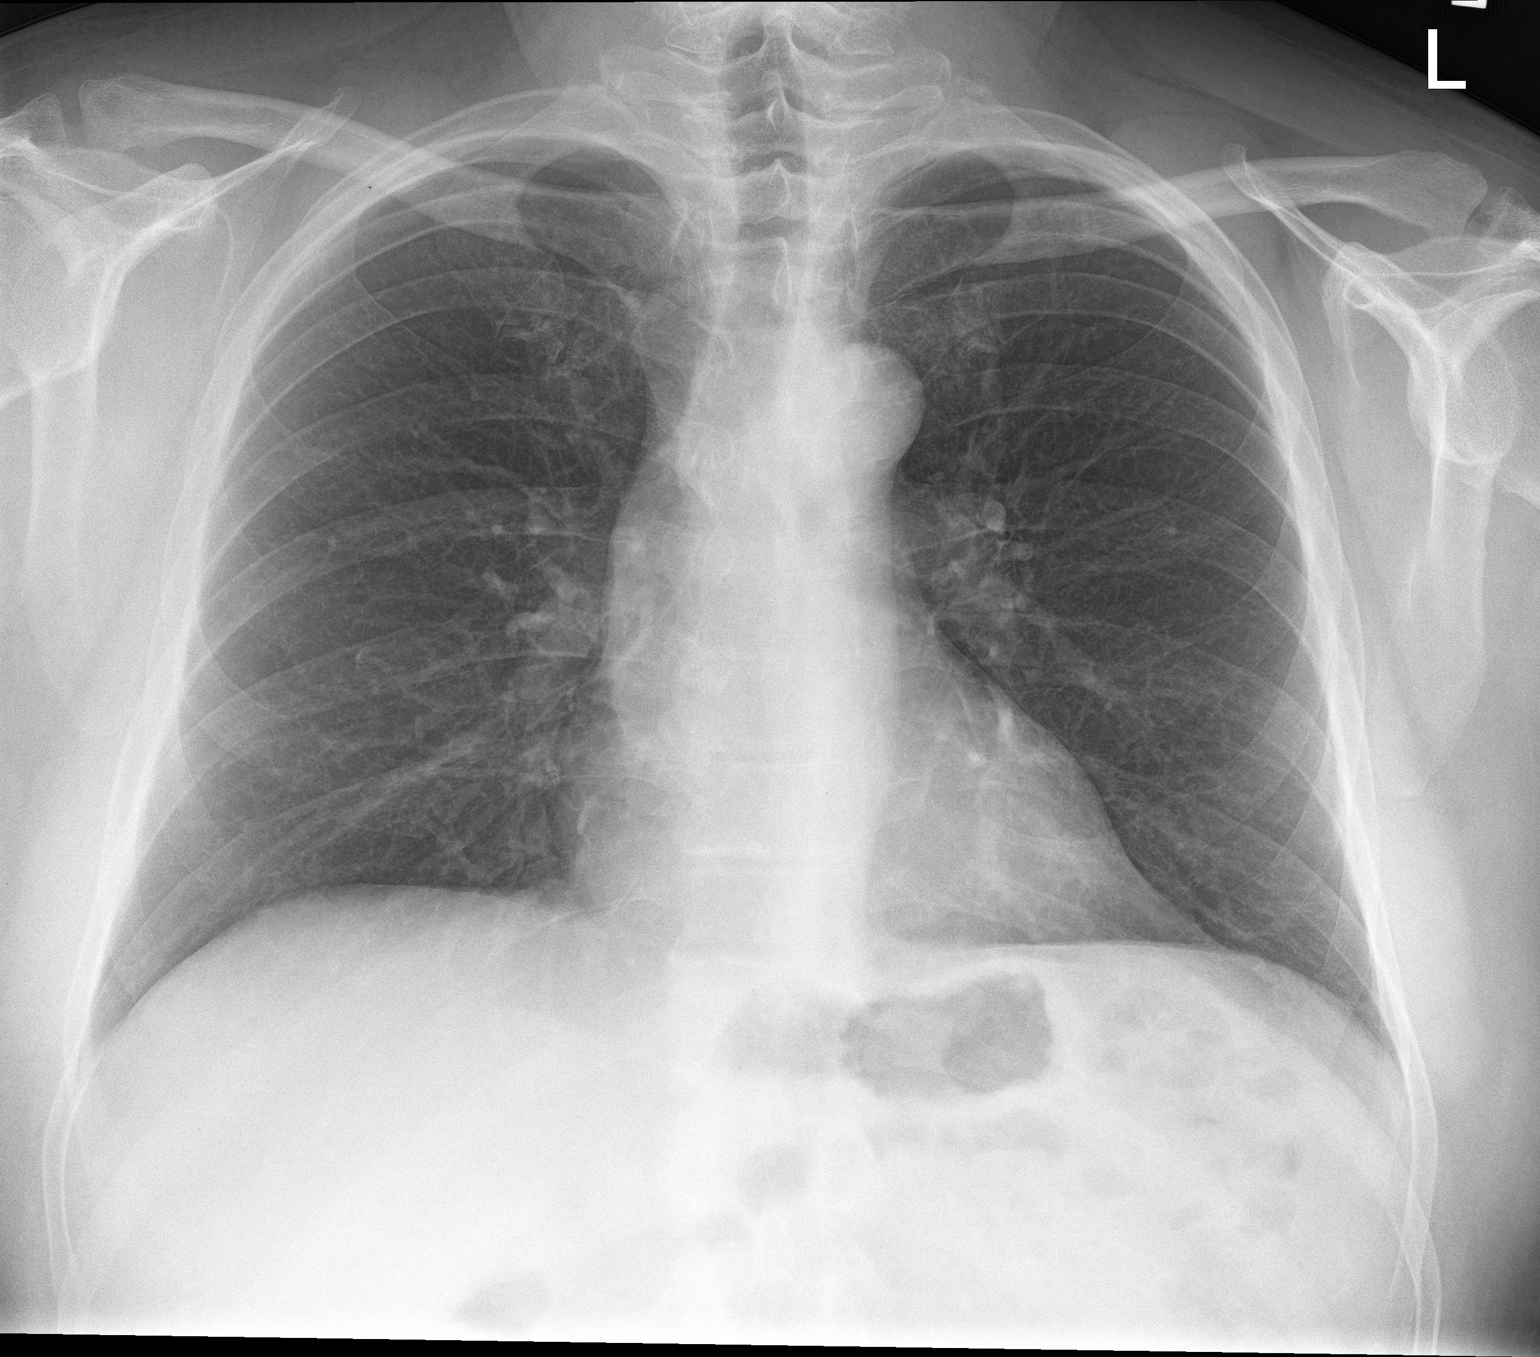

[chest lat]
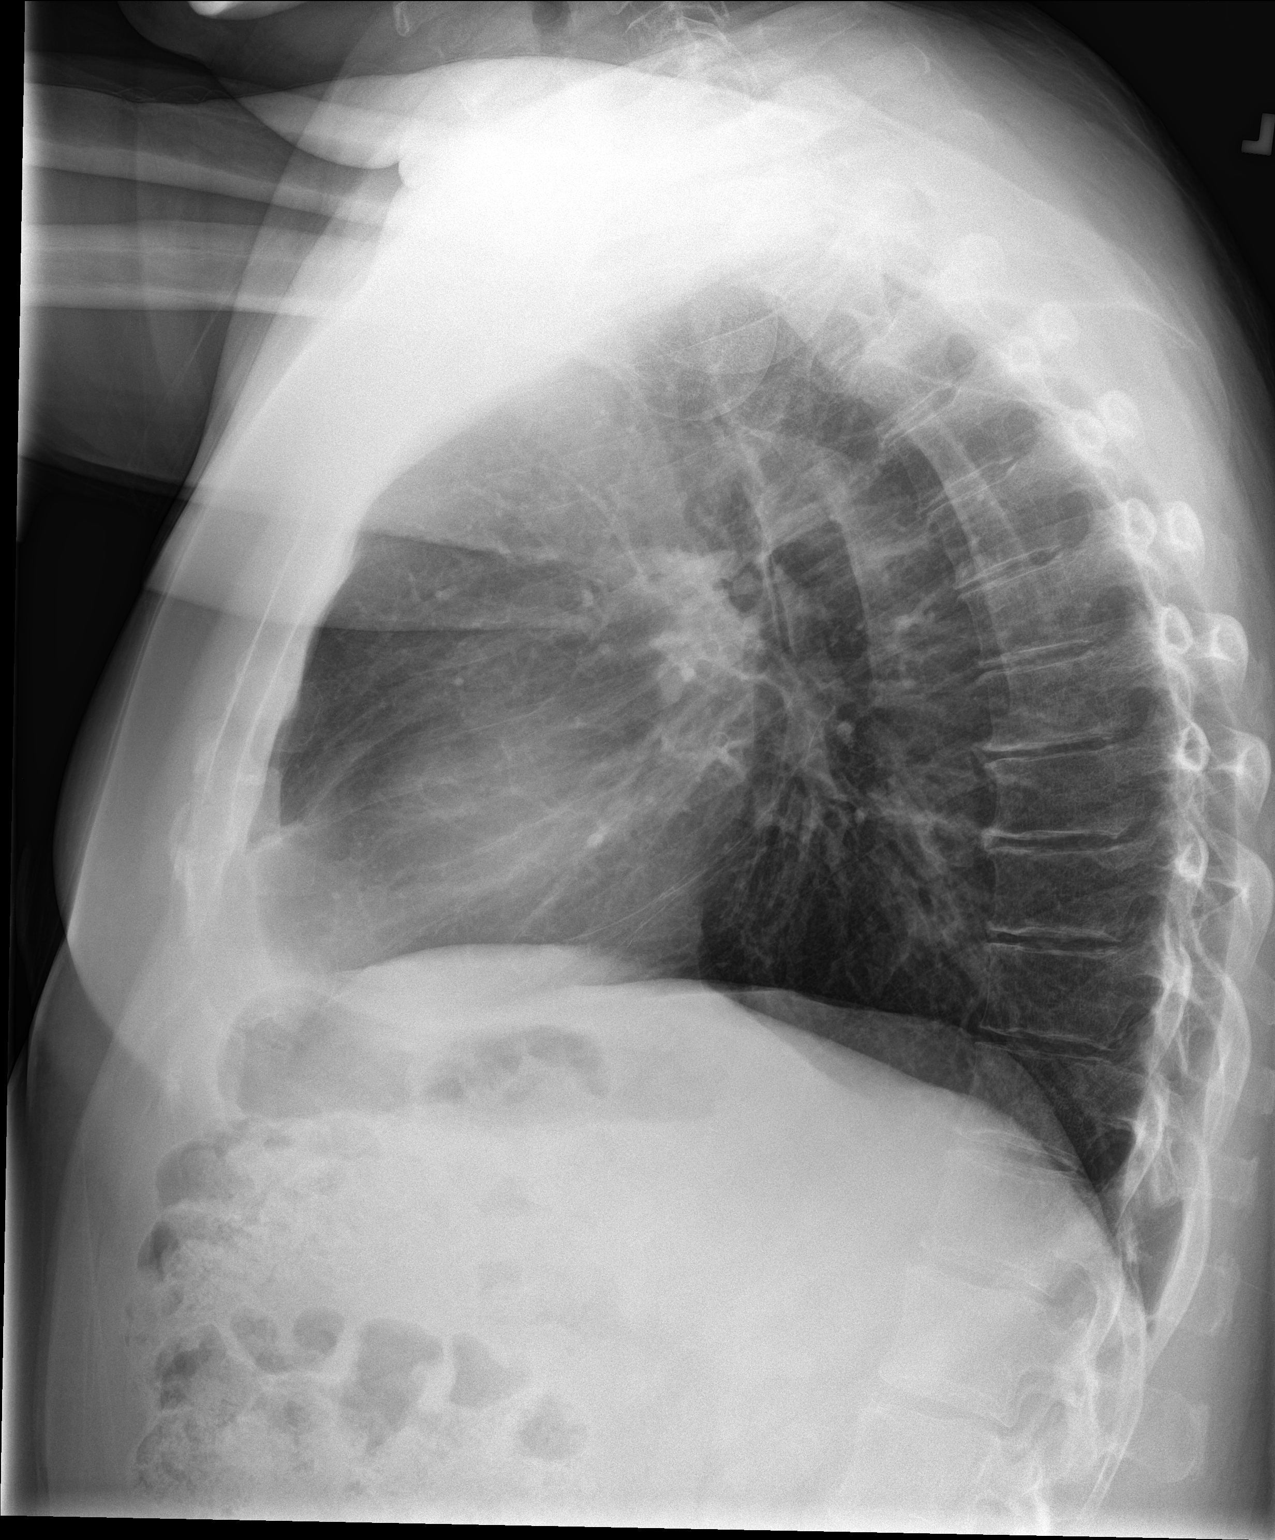

[2 of 2 positions shown; findings below may reference images not displayed]

FINDINGS: The heart size and mediastinal contours are within normal limits.
Both lungs are clear. The visualized skeletal structures are
unremarkable.
IMPRESSION: No active cardiopulmonary disease.

## 2019-04-17 ENCOUNTER — Other Ambulatory Visit (HOSPITAL_COMMUNITY): Payer: Self-pay | Admitting: Preventative Medicine

## 2019-04-17 DIAGNOSIS — M7542 Impingement syndrome of left shoulder: Secondary | ICD-10-CM

## 2019-04-17 DIAGNOSIS — M25812 Other specified joint disorders, left shoulder: Secondary | ICD-10-CM

## 2019-04-19 ENCOUNTER — Ambulatory Visit (HOSPITAL_COMMUNITY)
Admission: RE | Admit: 2019-04-19 | Discharge: 2019-04-19 | Disposition: A | Discharge status: Home / Self Care | Payer: Worker's Compensation | Source: Ambulatory Visit | Attending: Preventative Medicine | Admitting: Preventative Medicine

## 2019-04-19 ENCOUNTER — Other Ambulatory Visit: Payer: Self-pay

## 2019-04-19 DIAGNOSIS — M7542 Impingement syndrome of left shoulder: Secondary | ICD-10-CM | POA: Diagnosis present

## 2019-07-17 ENCOUNTER — Other Ambulatory Visit: Payer: Self-pay

## 2019-07-17 DIAGNOSIS — Z20822 Contact with and (suspected) exposure to covid-19: Secondary | ICD-10-CM

## 2019-07-17 NOTE — Progress Notes (Unsigned)
lab

## 2019-07-18 LAB — NOVEL CORONAVIRUS, NAA: SARS-CoV-2, NAA: NOT DETECTED

## 2019-11-29 ENCOUNTER — Other Ambulatory Visit: Payer: PRIVATE HEALTH INSURANCE

## 2021-04-29 ENCOUNTER — Emergency Department (HOSPITAL_COMMUNITY)
Admission: EM | Admit: 2021-04-29 | Discharge: 2021-04-30 | Disposition: A | Discharge status: Home / Self Care | Payer: PRIVATE HEALTH INSURANCE | Attending: Emergency Medicine | Admitting: Emergency Medicine

## 2021-04-29 ENCOUNTER — Emergency Department (HOSPITAL_COMMUNITY): Payer: PRIVATE HEALTH INSURANCE

## 2021-04-29 ENCOUNTER — Encounter (HOSPITAL_COMMUNITY): Payer: Self-pay | Admitting: Emergency Medicine

## 2021-04-29 ENCOUNTER — Other Ambulatory Visit: Payer: Self-pay

## 2021-04-29 DIAGNOSIS — Z20822 Contact with and (suspected) exposure to covid-19: Secondary | ICD-10-CM | POA: Diagnosis not present

## 2021-04-29 DIAGNOSIS — E876 Hypokalemia: Secondary | ICD-10-CM | POA: Insufficient documentation

## 2021-04-29 DIAGNOSIS — R531 Weakness: Secondary | ICD-10-CM | POA: Diagnosis present

## 2021-04-29 DIAGNOSIS — I10 Essential (primary) hypertension: Secondary | ICD-10-CM | POA: Diagnosis not present

## 2021-04-29 DIAGNOSIS — Z79899 Other long term (current) drug therapy: Secondary | ICD-10-CM | POA: Insufficient documentation

## 2021-04-29 DIAGNOSIS — R55 Syncope and collapse: Secondary | ICD-10-CM | POA: Diagnosis not present

## 2021-04-29 LAB — CBC WITH DIFFERENTIAL/PLATELET
Abs Immature Granulocytes: 0.05 10*3/uL (ref 0.00–0.07)
Basophils Absolute: 0.1 10*3/uL (ref 0.0–0.1)
Basophils Relative: 1 %
Eosinophils Absolute: 0.2 10*3/uL (ref 0.0–0.5)
Eosinophils Relative: 1 %
HCT: 41.9 % (ref 39.0–52.0)
Hemoglobin: 14.4 g/dL (ref 13.0–17.0)
Immature Granulocytes: 0 %
Lymphocytes Relative: 11 %
Lymphs Abs: 1.4 10*3/uL (ref 0.7–4.0)
MCH: 29.8 pg (ref 26.0–34.0)
MCHC: 34.4 g/dL (ref 30.0–36.0)
MCV: 86.7 fL (ref 80.0–100.0)
Monocytes Absolute: 1.3 10*3/uL — ABNORMAL HIGH (ref 0.1–1.0)
Monocytes Relative: 9 %
Neutro Abs: 10.6 10*3/uL — ABNORMAL HIGH (ref 1.7–7.7)
Neutrophils Relative %: 78 %
Platelets: 286 10*3/uL (ref 150–400)
RBC: 4.83 MIL/uL (ref 4.22–5.81)
RDW: 12.2 % (ref 11.5–15.5)
WBC: 13.5 10*3/uL — ABNORMAL HIGH (ref 4.0–10.5)
nRBC: 0 % (ref 0.0–0.2)

## 2021-04-29 LAB — URINALYSIS, ROUTINE W REFLEX MICROSCOPIC
Bilirubin Urine: NEGATIVE
Glucose, UA: NEGATIVE mg/dL
Hgb urine dipstick: NEGATIVE
Ketones, ur: NEGATIVE mg/dL
Leukocytes,Ua: NEGATIVE
Nitrite: NEGATIVE
Protein, ur: NEGATIVE mg/dL
Specific Gravity, Urine: 1.02 (ref 1.005–1.030)
pH: 5 (ref 5.0–8.0)

## 2021-04-29 LAB — COMPREHENSIVE METABOLIC PANEL
ALT: 23 U/L (ref 0–44)
AST: 21 U/L (ref 15–41)
Albumin: 4 g/dL (ref 3.5–5.0)
Alkaline Phosphatase: 64 U/L (ref 38–126)
Anion gap: 11 (ref 5–15)
BUN: 21 mg/dL — ABNORMAL HIGH (ref 6–20)
CO2: 25 mmol/L (ref 22–32)
Calcium: 9.1 mg/dL (ref 8.9–10.3)
Chloride: 101 mmol/L (ref 98–111)
Creatinine, Ser: 1.35 mg/dL — ABNORMAL HIGH (ref 0.61–1.24)
GFR, Estimated: >60 mL/min (ref >60)
Glucose, Bld: 115 mg/dL — ABNORMAL HIGH (ref 70–99)
Potassium: 3 mmol/L — ABNORMAL LOW (ref 3.5–5.1)
Sodium: 137 mmol/L (ref 135–145)
Total Bilirubin: 1.1 mg/dL (ref 0.3–1.2)
Total Protein: 7.6 g/dL (ref 6.5–8.1)

## 2021-04-29 LAB — CBG MONITORING, ED: Glucose-Capillary: 103 mg/dL — ABNORMAL HIGH (ref 70–99)

## 2021-04-29 LAB — RESP PANEL BY RT-PCR (FLU A&B, COVID) ARPGX2
Influenza A by PCR: NEGATIVE
Influenza B by PCR: NEGATIVE
SARS Coronavirus 2 by RT PCR: NEGATIVE

## 2021-04-29 MED ORDER — POTASSIUM CHLORIDE CRYS ER 20 MEQ PO TBCR
40.0000 meq | EXTENDED_RELEASE_TABLET | Freq: Once | ORAL | Status: AC
  Administered 2021-04-29: 40 meq via ORAL
  Filled 2021-04-29: qty 2

## 2021-04-29 MED ORDER — SODIUM CHLORIDE 0.9 % IV BOLUS
500.0000 mL | Freq: Once | INTRAVENOUS | Status: AC
  Administered 2021-04-29: 500 mL via INTRAVENOUS

## 2021-04-29 NOTE — ED Triage Notes (Signed)
Pt [place] very lethargic ? Syncopal episode.

## 2021-04-29 NOTE — ED Provider Notes (Signed)
[facility] EMERGENCY DEPARTMENT Provider Note   CSN: [id number] Arrival date & time: 04/29/21  2058     History Chief Complaint  Patient presents with  . Loss of Consciousness    Howard Conner is a 55 y.o. male.  HPI Patient was in charge, saying and crying, when he felt weak and then hot.  He went outside where he collapsed to the ground.  He thinks he lost consciousness briefly.  He was assisted to his truck by bystanders.  EMS arrived and transferred him here for evaluation.  On arrival he feels better, but still feels weak.  He denies headache, chest pain, shortness of breath, focal weakness or paresthesia.  He had a normal breakfast and lunch today but has not eaten supper yet.  He denies recent illnesses including vomiting, diarrhea, cough, fevers, rash or change in medication.  There are no other known active modifying factors.    Past Medical History:  Diagnosis Date  . Hyperlipidemia   . Hypertension   . Kidney stone     Patient Active Problem List   Diagnosis Date Noted  . Essential hypertension 01/20/2017  . Hyperlipidemia 01/20/2017  . Chest pain 01/20/2017    Past Surgical History:  Procedure Laterality Date  . KIDNEY STONE SURGERY    . VASECTOMY         Family History  Problem Relation Age of Onset  . Lymphoma Father   . CAD Maternal Grandfather   . CAD Paternal Grandfather     Social History   Tobacco Use  . Smoking status: Never Smoker  . Smokeless tobacco: Never Used  Substance Use Topics  . Alcohol use: No  . Drug use: No    Home Medications Prior to Admission medications   Medication Sig Start Date End Date Taking? Authorizing Provider  albuterol (PROVENTIL HFA;VENTOLIN HFA) 108 (90 BASE) MCG/ACT inhaler Inhale 1-2 puffs into the lungs every 6 (six) hours as needed for shortness of breath.   Yes [provider]  amLODipine (NORVASC) 10 MG tablet Take 10 mg by mouth daily.   Yes [provider]  atorvastatin  (LIPITOR) 20 MG tablet Take 1 tablet by mouth at bedtime. 04/23/21  Yes [provider]  hydrochlorothiazide (HYDRODIURIL) 25 MG tablet Take 1 tablet by mouth daily. 04/23/21  Yes [provider]  ibuprofen (ADVIL,MOTRIN) 200 MG tablet Take 400 mg by mouth every 6 (six) hours as needed for moderate pain.    Yes [provider]  losartan (COZAAR) 100 MG tablet Take 1 tablet by mouth daily. 04/23/21  Yes [provider]  Omega-3 Fatty Acids (FISH OIL ADULT GUMMIES PO) Take 1 tablet by mouth daily.   Yes [provider]  potassium chloride SA (KLOR-CON) 20 MEQ tablet Take 1 tablet (20 mEq total) by mouth 2 (two) times daily. 04/30/21  Yes Daleen Bo, MD    Allergies    Patient has no known allergies.  Review of Systems   Review of Systems  All other systems reviewed and are negative.   Physical Exam Updated Vital Signs BP 118/69   Pulse 84   Temp 98.2 F (36.8 C) (Oral)   Resp (!) 22   Ht 5\' 8"  (1.727 m)   Wt 94 kg   SpO2 97%   BMI 31.51 kg/m   Physical Exam Vitals and nursing note reviewed.  Constitutional:      Appearance: He is well-developed.  HENT:     Head: Normocephalic and atraumatic.  Right Ear: External ear normal.     Left Ear: External ear normal.  Eyes:     Conjunctiva/sclera: Conjunctivae normal.     Pupils: Pupils are equal, round, and reactive to light.  Neck:     Trachea: Phonation normal.  Cardiovascular:     Rate and Rhythm: Normal rate and regular rhythm.     Heart sounds: Normal heart sounds.  Pulmonary:     Effort: Pulmonary effort is normal.     Breath sounds: Normal breath sounds.  Abdominal:     General: There is no distension.     Palpations: Abdomen is soft.     Tenderness: There is no abdominal tenderness.  Musculoskeletal:        General: No swelling, tenderness or deformity. Normal range of motion.     Cervical back: Normal range of motion and neck supple.     Comments: Normal strength  arms and legs bilaterally.  Skin:    General: Skin is warm and dry.  Neurological:     Mental Status: He is alert and oriented to person, place, and time.     Cranial Nerves: No cranial nerve deficit.     Sensory: No sensory deficit.     Motor: No abnormal muscle tone.     Coordination: Coordination normal.     Comments: No dysarthria aphasia or nystagmus.  Psychiatric:        Mood and Affect: Mood normal.        Behavior: Behavior normal.        Thought Content: Thought content normal.        Judgment: Judgment normal.     ED Results / Procedures / Treatments   Labs (all labs ordered are listed, but only abnormal results are displayed) Labs Reviewed  COMPREHENSIVE METABOLIC PANEL - Abnormal; Notable for the following components:      Result Value   Potassium 3.0 (*)    Glucose, Bld 115 (*)    BUN 21 (*)    Creatinine, Ser 1.35 (*)    All other components within normal limits  CBC WITH DIFFERENTIAL/PLATELET - Abnormal; Notable for the following components:   WBC 13.5 (*)    Neutro Abs 10.6 (*)    Monocytes Absolute 1.3 (*)    All other components within normal limits  CBG MONITORING, ED - Abnormal; Notable for the following components:   Glucose-Capillary 103 (*)    All other components within normal limits  RESP PANEL BY RT-PCR (FLU A&B, COVID) [id number]  URINALYSIS, ROUTINE W REFLEX MICROSCOPIC  MAGNESIUM    EKG EKG Interpretation  Date/Time:  Tuesday Apr 29 2021 21:47:23 EDT Ventricular Rate:  76 PR Interval:  155 QRS Duration: 105 QT Interval:  387 QTC Calculation: 436 R Axis:   139 Text Interpretation: Sinus rhythm Right axis deviation Borderline T abnormalities, inferior leads since last tracing no significant change Confirmed by Daleen Bo [id number]) on 04/29/2021 10:00:06 PM   Radiology DG Chest 2 View  Result Date: 04/29/2021 CLINICAL DATA:  Found in truck with increased lethargy, history of recent syncopal episode, initial encounter EXAM: CHEST - 2  VIEW COMPARISON:  12/11/2016 FINDINGS: Cardiac shadow is within normal limits. Mild aortic calcifications are noted. The lungs are clear. No bony abnormality is seen. IMPRESSION: No acute abnormality noted. Electronically Signed   By: Inez Catalina M.D.   On: 04/29/2021 22:20    Procedures Procedures   Medications Ordered in ED Medications  sodium chloride 0.9 % bolus 500 mL (  0 mLs Intravenous Stopped 04/29/21 2351)  potassium chloride SA (KLOR-CON) CR tablet 40 mEq (40 mEq Oral Given 04/29/21 2352)    ED Course  I have reviewed the triage vital signs and the nursing notes.  Pertinent labs & imaging results that were available during my care of the patient were reviewed by me and considered in my medical decision making (see chart for details).    MDM Rules/Calculators/A&P                           Patient Vitals for the past 24 hrs:  BP Temp Temp src Pulse Resp SpO2 Height Weight  04/30/21 0000 118/69 -- -- 84 (!) 22 97 % -- --  04/29/21 2330 103/75 -- -- 69 (!) 22 94 % -- --  04/29/21 2300 123/85 -- -- 78 15 99 % -- --  04/29/21 2226 114/87 -- -- 85 15 99 % -- --  04/29/21 2200 127/87 -- -- 78 (!) 21 100 % -- --  04/29/21 2145 114/84 -- -- 74 12 94 % -- --  04/29/21 2112 -- 98.2 F (36.8 C) Oral 72 18 97 % -- --  04/29/21 2107 -- -- -- -- -- -- 5\' 8"  (1.727 m) 94 kg    1:04 AM Reevaluation with update and discussion. After initial assessment and treatment, an updated evaluation reveals he is able ambulate easily and has no further complaints.  Findings discussed and questions answered. Daleen Bo   Medical Decision Making:  This patient is presenting for evaluation of syncopal episode, which does require a range of treatment options, and is a complaint that involves a moderate risk of morbidity and mortality. The differential diagnoses include acute illness, metabolic disorder, cardiac disorder. I decided to review old records, and in summary healthy middle-aged male  presenting after vasovagal type syncope.  I obtained additional historical information from patient's brother at bedside.  Clinical Laboratory Tests Ordered, included CBC, Metabolic panel, Urinalysis and Viral panel. Review indicates normal except white count high, potassium low, glucose high, BUN high, creatinine high. Radiologic Tests Ordered, included chest x-ray.  I independently Visualized: Radiographic images, which show no infiltrate or edema    Critical Interventions-clinical evaluation, laboratory testing, radiography, observation reassessment  After These Interventions, the Patient was reevaluated and was found patient with likely vasovagal episode associated with hot weather and mild dehydration.  No evidence for unstable cardiac or pulmonary disorder.  Improved after treatment and stable for discharge.  Prescription sent for potassium  CRITICAL CARE-no Performed by: Daleen Bo  Nursing Notes Reviewed/ Care Coordinated Applicable Imaging Reviewed Interpretation of Laboratory Data incorporated into ED treatment  The patient appears reasonably screened and/or stabilized for discharge and I doubt any other medical condition or other [facility] requiring further screening, evaluation, or treatment in the ED at this time prior to discharge.  Plan: Home Medications-continue usual; Home Treatments-gradual advance diet and activity; return here if the recommended treatment, does not improve the symptoms; Recommended follow up-PCP follow-up 1 week and PRN     Final Clinical Impression(s) / ED Diagnoses Final diagnoses:  Vasovagal episode  Hypokalemia    Rx / DC Orders ED Discharge Orders         Ordered    potassium chloride SA (KLOR-CON) 20 MEQ tablet  2 times daily        04/30/21 0017           Daleen Bo, MD 04/30/21 0105

## 2021-04-30 LAB — MAGNESIUM: Magnesium: 1.9 mg/dL (ref 1.7–2.4)

## 2021-04-30 MED ORDER — POTASSIUM CHLORIDE CRYS ER 20 MEQ PO TBCR: 20.0000 meq | EXTENDED_RELEASE_TABLET | Freq: Two times a day (BID) | ORAL | 0 refills | Status: DC

## 2021-04-30 NOTE — Discharge Instructions (Addendum)
Make sure you are getting plenty of rest, drink a lot of fluids and eat a regular diet.  Try to eat foods which contain more potassium.  Rest at home for a few days to build her strength up.  See your doctor for checkup next week.  Return here, if needed, for problems.

## 2021-04-30 NOTE — ED Notes (Signed)
Pt ambulated in hallway around station. Pt steady gait, states felt fine with no issues.

## 2021-05-01 ENCOUNTER — Other Ambulatory Visit (HOSPITAL_COMMUNITY): Payer: Self-pay | Admitting: Internal Medicine

## 2021-05-01 ENCOUNTER — Other Ambulatory Visit: Payer: Self-pay

## 2021-05-01 ENCOUNTER — Ambulatory Visit (HOSPITAL_COMMUNITY)
Admission: RE | Admit: 2021-05-01 | Discharge: 2021-05-01 | Disposition: A | Discharge status: Home / Self Care | Payer: PRIVATE HEALTH INSURANCE | Source: Ambulatory Visit | Attending: Internal Medicine | Admitting: Internal Medicine

## 2021-05-01 DIAGNOSIS — S8992XA Unspecified injury of left lower leg, initial encounter: Secondary | ICD-10-CM | POA: Insufficient documentation

## 2021-07-01 ENCOUNTER — Telehealth: Payer: No Typology Code available for payment source | Admitting: Emergency Medicine

## 2021-07-01 DIAGNOSIS — U071 COVID-19: Secondary | ICD-10-CM

## 2021-07-01 MED ORDER — MOLNUPIRAVIR EUA 200MG CAPSULE: 4.0000 | ORAL_CAPSULE | Freq: Two times a day (BID) | ORAL | 0 refills | Status: AC

## 2021-07-01 MED ORDER — BENZONATATE 100 MG PO CAPS: 100.0000 mg | ORAL_CAPSULE | Freq: Two times a day (BID) | ORAL | 0 refills | Status: DC | PRN

## 2021-07-01 NOTE — Progress Notes (Signed)
Virtual Visit Consent   Howard Conner, you are scheduled for a virtual visit with a [facility] provider today.     Just as with appointments in the office, your consent must be obtained to participate.  Your consent will be active for this visit and any virtual visit you may have with one of our providers in the next 365 days.     If you have a MyChart account, a copy of this consent can be sent to you electronically.  All virtual visits are billed to your insurance company just like a traditional visit in the office.    As this is a virtual visit, video technology does not allow for your provider to perform a traditional examination.  This may limit your provider's ability to fully assess your condition.  If your provider identifies any concerns that need to be evaluated in person or the need to arrange testing (such as labs, EKG, etc.), we will make arrangements to do so.     Although advances in technology are sophisticated, we cannot ensure that it will always work on either your end or our end.  If the connection with a video visit is poor, the visit may have to be switched to a telephone visit.  With either a video or telephone visit, we are not always able to ensure that we have a secure connection.     I need to obtain your verbal consent now.   Are you willing to proceed with your visit today?    Howard Conner has provided verbal consent on 07/01/2021 for a virtual visit (video or telephone).   Montine Circle, PA-C   Date: 07/01/2021 2:47 PM   Virtual Visit via Video Note   I, Montine Circle, connected with  Howard Conner  ([id number], 06-13-1966) on 07/01/21 at  2:45 PM EDT by a video-enabled telemedicine application and verified that I am speaking with the correct person using two identifiers.  Location: Patient: Virtual Visit Location Patient: Home Provider: Virtual Visit Location Provider: Home Office   I discussed the limitations of evaluation and  management by telemedicine and the availability of in person appointments. The patient expressed understanding and agreed to proceed.    History of Present Illness: Howard Conner is a 55 y.o. who identifies as a male who was assigned male at birth, and is being seen today for COVID-19.  Denies SOB. Has had low-grade temp.  Spouse sick with the same.  Symptoms have been gradually worsening.  HPI: HPI  Problems:  Patient Active Problem List   Diagnosis Date Noted   Essential hypertension 01/20/2017   Hyperlipidemia 01/20/2017   Chest pain 01/20/2017    Allergies: No Known Allergies Medications:  Current Outpatient Medications:    albuterol (PROVENTIL HFA;VENTOLIN HFA) 108 (90 BASE) MCG/ACT inhaler, Inhale 1-2 puffs into the lungs every 6 (six) hours as needed for shortness of breath., Disp: , Rfl:    amLODipine (NORVASC) 10 MG tablet, Take 10 mg by mouth daily., Disp: , Rfl:    atorvastatin (LIPITOR) 20 MG tablet, Take 1 tablet by mouth at bedtime., Disp: , Rfl:    hydrochlorothiazide (HYDRODIURIL) 25 MG tablet, Take 1 tablet by mouth daily., Disp: , Rfl:    ibuprofen (ADVIL,MOTRIN) 200 MG tablet, Take 400 mg by mouth every 6 (six) hours as needed for moderate pain. , Disp: , Rfl:    losartan (COZAAR) 100 MG tablet, Take 1 tablet by mouth daily., Disp: , Rfl:  Omega-3 Fatty Acids (FISH OIL ADULT GUMMIES PO), Take 1 tablet by mouth daily., Disp: , Rfl:    potassium chloride SA (KLOR-CON) 20 MEQ tablet, Take 1 tablet (20 mEq total) by mouth 2 (two) times daily., Disp: 10 tablet, Rfl: 0  Observations/Objective: Patient is well-developed, well-nourished in no acute distress.  Resting comfortably at home.  Head is normocephalic, atraumatic.  No labored breathing. Speech is clear and coherent with logical content.  Patient is alert and oriented at baseline.   Assessment and Plan: 1. COVID-19 - Molnupiravir - Tessalon  Follow Up Instructions: I discussed the assessment and  treatment plan with the patient. The patient was provided an opportunity to ask questions and all were answered. The patient agreed with the plan and demonstrated an understanding of the instructions.  A copy of instructions were sent to the patient via MyChart.  The patient was advised to call back or seek an in-person evaluation if the symptoms worsen or if the condition fails to improve as anticipated.  Time:  I spent 10 minutes with the patient via telehealth technology discussing the above problems/concerns.    Montine Circle, PA-C

## 2022-02-03 ENCOUNTER — Encounter: Payer: Self-pay | Admitting: *Deleted

## 2022-03-04 ENCOUNTER — Encounter: Payer: Self-pay | Admitting: Internal Medicine

## 2022-03-04 ENCOUNTER — Other Ambulatory Visit: Payer: Self-pay

## 2022-03-04 ENCOUNTER — Ambulatory Visit (INDEPENDENT_AMBULATORY_CARE_PROVIDER_SITE_OTHER): Payer: PRIVATE HEALTH INSURANCE | Admitting: Internal Medicine

## 2022-03-04 VITALS — BP 118/82 | HR 76

## 2022-03-04 DIAGNOSIS — I10 Essential (primary) hypertension: Secondary | ICD-10-CM | POA: Diagnosis not present

## 2022-03-04 MED ORDER — LOSARTAN POTASSIUM 50 MG PO TABS: 50.0000 mg | ORAL_TABLET | Freq: Every day | ORAL | 3 refills | Status: DC

## 2022-03-04 NOTE — Progress Notes (Addendum)
? ?Cardiology Office Note ? ? ?Date:  03/04/2022  ? ?ID:  Howard Conner, DOB Oct 20, 1966, MRN [id number] ? ?PCP:  Fay Records, MD  ?Cardiologist:   Dorris Carnes, MD  ? ?Pt presents for evaluatin of dizziness  ? ?  ?History of Present Illness: ?Howard Conner is a 56 y.o. male with a history of HTN, HL, obesity   ? Last seen in cardiology in 2018 by Gerrianne Scale for CP    ? ?Back in 2018 CP was sharp, shooting   Occurred with change in position    ?Felt to be musculoskeletal    ? ?May 2022  At church function  Got hot   Stopped outside   Dizzy   Weak  nauseated   On ground    EMS  Fluids   given    Feeling  ? ?Since then has had several more episodes   Dzzy   Rare nausea   Several times lightheaded   Eerratic   Not just when hot   Different    ? ?Dr Gerarda Fraction took him took him off of HCTZ      ?BP 110s    No real dizziness ? ?Breaghing is OK ? ? ?Diet ?Breakfast   Biscuit (bacon and egg or chicken)   Pepsi ?Lunch   hamburger fries and drink   or plate     Pepsi ?Dinner   [place] or bolongi sandwich    Pepsi ? ?No plain water  ? ? ? ? ? ?Current Meds  ?Medication Sig  ? albuterol (PROVENTIL HFA;VENTOLIN HFA) 108 (90 BASE) MCG/ACT inhaler Inhale 1-2 puffs into the lungs every 6 (six) hours as needed for shortness of breath.  ? amLODipine (NORVASC) 10 MG tablet Take 10 mg by mouth daily.  ? benzonatate (TESSALON) 100 MG capsule Take 1 capsule (100 mg total) by mouth 2 (two) times daily as needed for cough.  ? ibuprofen (ADVIL,MOTRIN) 200 MG tablet Take 400 mg by mouth every 6 (six) hours as needed for moderate pain.   ? losartan (COZAAR) 100 MG tablet Take 1 tablet by mouth daily.  ? Omega-3 Fatty Acids (FISH OIL ADULT GUMMIES PO) Take 1 tablet by mouth daily.  ? potassium chloride (MICRO-K) 10 MEQ CR capsule Take 10 mEq by mouth daily.  ? rosuvastatin (CRESTOR) 20 MG tablet Take 20 mg by mouth daily.  ? ? ? ?Allergies:   Patient has no known allergies.  ? ?Past Medical History:  ?Diagnosis Date  ? Hyperlipidemia   ?  Hypertension   ? Kidney stone   ? ? ?Past Surgical History:  ?Procedure Laterality Date  ? KIDNEY STONE SURGERY    ? VASECTOMY    ? ? ? ?Social History:  The patient  reports that he has never smoked. He has never used smokeless tobacco. He reports that he does not drink alcohol and does not use drugs.  ? ?Family History:  The patient's family history includes CAD in his maternal grandfather and paternal grandfather; Lymphoma in his father.  ? ? ?ROS:  Please see the history of present illness. All other systems are reviewed and  Negative to the above problem except as noted.  ? ? ?PHYSICAL EXAM: ?VS:  BP 118/82   Pulse 76   SpO2 95%   ? ?BP laying 110/76  P 68   Sintting  112/80  P 78   Standing 3 mn 106/80  P 78   ?GEN: Well nourished, well developed, in no  acute distress  ?HEENT: normal  ?Neck: no JVD, carotid bruits, or masses ?Cardiac: RRR; no murmurs, rubs, or gallops,no edema  ?Respiratory:  clear to auscultation bilaterally, normal work of breathing ?GI: soft, nontender, nondistended, + BS  No hepatomegaly  ?MS: no deformity Moving all extremities   ?Skin: warm and dry, no rash ?Neuro:  Strength and sensation are intact ?Psych: euthymic mood, full affect ? ? ?EKG:  EKG is ordered today.  SR  70 bpm  Incomplete RBBB ? ? ?Lipid Panel ?No results found for: CHOL, TRIG, HDL, CHOLHDL, VLDL, LDLCALC, LDLDIRECT ?  ? ?Wt Readings from Last 3 Encounters:  ?04/29/21 207 lb 3.7 oz (94 kg)  ?01/20/17 206 lb (93.4 kg)  ?09/26/15 200 lb (90.7 kg)  ?  ? ? ?ASSESSMENT AND PLAN: ? ?1  Dizziness / syncope   Pt had on episode of syncope last May   Sounds like spell due to overheating    He has had several spells of dizziness since   Not always when hot    ?REcently his BP meds were decreased and he says he has felt better    ?ON exam today he has some variation in HR but overall not significant   BP with standing was in the low 100s     He is at risk if drops further  ? ?I think spellls most likely to to hypotension at times    (heat, hydration affecting )   ?I would recomm he cut back on losartan to 50   Follow BP and symtpoms at home     ?Hydrate, hydrate, hydrate ?Stay active   ? ?2  HTN   As above    ? ?3  HL   Neet to get most recent labs  ? ?4  CP   Pt denies   ? ?5   Diet   Reivewed diet with pt    He is having lots of processed foods, sodas   Discussed low carb, low added sugar.   Minimally processed food  ? ?Current medicines are reviewed at length with the patient today.  The patient does not have concerns regarding medicines. ? ?Signed, ?Dorris Carnes, MD  ?03/04/2022 4:05 PM    ?[facility] ?[address], [place], [place]  [address] ?Phone: [phone]; Fax: [phone]  ? ? ?

## 2022-03-04 NOTE — Patient Instructions (Signed)
Medication Instructions:  ? ?Decrease Losartan to 50 mg Daily  ? ?*If you need a refill on your cardiac medications before your next appointment, please call your pharmacy* ? ? ?Lab Work: ?NONE  ? ?If you have labs (blood work) drawn today and your tests are completely normal, you will receive your results only by: ?MyChart Message (if you have MyChart) OR ?A paper copy in the mail ?If you have any lab test that is abnormal or we need to change your treatment, we will call you to review the results. ? ? ?Testing/Procedures: ?NONE  ? ? ?Follow-Up: ?At [facility], you and your health needs are our priority.  As part of our continuing mission to provide you with exceptional heart care, we have created designated Provider Care Teams.  These Care Teams include your primary Cardiologist (physician) and Advanced Practice Providers (APPs -  Physician Assistants and Nurse Practitioners) who all work together to provide you with the care you need, when you need it. ? ?We recommend signing up for the patient portal called "MyChart".  Sign up information is provided on this After Visit Summary.  MyChart is used to connect with patients for Virtual Visits (Telemedicine).  Patients are able to view lab/test results, encounter notes, upcoming appointments, etc.  Non-urgent messages can be sent to your provider as well.   ?To learn more about what you can do with MyChart, go to [email or web address].   ? ?Your next appointment:   ? As Needed  ? ?The format for your next appointment:   ?In Person ? ?Provider:   ?Dorris Carnes, MD  ? ? ?Other Instructions ?Thank you for choosing [facility]! ?  ? ? ?

## 2022-04-02 ENCOUNTER — Ambulatory Visit (INDEPENDENT_AMBULATORY_CARE_PROVIDER_SITE_OTHER): Payer: Self-pay | Admitting: *Deleted

## 2022-04-02 VITALS — Ht 68.0 in | Wt 200.0 lb

## 2022-04-02 DIAGNOSIS — Z1211 Encounter for screening for malignant neoplasm of colon: Secondary | ICD-10-CM

## 2022-04-02 NOTE — Progress Notes (Addendum)
Gastroenterology Pre-Procedure Review ? ?Request Date: 04/02/2022 ?Requesting Physician: Dr. Gerarda Fraction, no previous TCS ? ?PATIENT REVIEW QUESTIONS: The patient responded to the following health history questions as indicated:   ? ?1. Diabetes Melitis: no ?2. Joint replacements in the past 12 months: no ?3. Major health problems in the past 3 months: no ?4. Has an artificial valve or MVP: no ?5. Has a defibrillator: no ?6. Has been advised in past to take antibiotics in advance of a procedure like teeth cleaning: no ?7. Family history of colon cancer: no  ?8. Alcohol Use: no ?9. Illicit drug Use: no ?10. History of sleep apnea: no  ?11. History of coronary artery or other vascular stents placed within the last 12 months: no ?12. History of any prior anesthesia complications: no ?13. Body mass index is 30.41 kg/m?. ?   ?MEDICATIONS & ALLERGIES:    ?Patient reports the following regarding taking any blood thinners:   ?Plavix? no ?Aspirin? no ?Coumadin? no ?Brilinta? no ?Xarelto? no ?Eliquis? no ?Pradaxa? no ?Savaysa? no ?Effient? no ? ?Patient confirms/reports the following medications:  ?Current Outpatient Medications  ?Medication Sig Dispense Refill  ? albuterol (PROVENTIL HFA;VENTOLIN HFA) 108 (90 BASE) MCG/ACT inhaler Inhale 1-2 puffs into the lungs as needed for shortness of breath.    ? amLODipine (NORVASC) 10 MG tablet Take 10 mg by mouth daily.    ? atorvastatin (LIPITOR) 20 MG tablet Take 1 tablet by mouth at bedtime.    ? ibuprofen (ADVIL,MOTRIN) 200 MG tablet Take 400 mg by mouth as needed for moderate pain.    ? losartan (COZAAR) 50 MG tablet Take 1 tablet (50 mg total) by mouth daily. 90 tablet 3  ? Omega-3 Fatty Acids (FISH OIL ADULT GUMMIES PO) Take 1 tablet by mouth daily.    ? ?No current facility-administered medications for this visit.  ? ? ?Patient confirms/reports the following allergies:  ?No Known Allergies ? ?No orders of the defined types were placed in this encounter. ? ? ?AUTHORIZATION  INFORMATION ?Primary Insurance: [employer],  [place] #: [id number],  Group #: [id number] ?Pre-Cert / Josem Kaufmann required: No, not required per Marquette Old ?Pre-Cert / Josem Kaufmann #: REF: Marquette Old 04/14/2022 ? ?SCHEDULE INFORMATION: ?Procedure has been scheduled as follows:  ?Date: 04/29/2022, Time: 10:00 ?Location: [facility] with Dr. Gala Romney ? ?This Gastroenterology Pre-Precedure Review Form is being routed to the following provider(s): Neil Crouch, PA-C ?  ?

## 2022-04-06 ENCOUNTER — Ambulatory Visit: Payer: Self-pay

## 2022-04-10 NOTE — Progress Notes (Signed)
Ok to schedule. ASA 2.  

## 2022-04-13 ENCOUNTER — Encounter: Payer: Self-pay | Admitting: *Deleted

## 2022-04-13 MED ORDER — NA SULFATE-K SULFATE-MG SULF 17.5-3.13-1.6 GM/177ML PO SOLN: 1.0000 | Freq: Once | ORAL | 0 refills | Status: AC

## 2022-04-13 NOTE — Progress Notes (Addendum)
Spoke to pt.  Scheduled procedure for 04/29/2022 at 10:45, arrival at 9:15 at [facility].  Reviewed prep instructions with pt by phone. He was made aware to pick up prep kit at pharmacy.  Pt aware I am mailing out instructions.  Confirmed mailing address with pt. ?

## 2022-04-13 NOTE — Addendum Note (Signed)
Addended by: Metro Kung on: 04/13/2022 04:48 PM ? ? Modules accepted: Orders ? ?

## 2022-04-14 ENCOUNTER — Other Ambulatory Visit: Payer: Self-pay | Admitting: *Deleted

## 2022-04-14 ENCOUNTER — Encounter: Payer: Self-pay | Admitting: *Deleted

## 2022-04-29 ENCOUNTER — Other Ambulatory Visit: Payer: Self-pay

## 2022-04-29 ENCOUNTER — Encounter (HOSPITAL_COMMUNITY): Payer: Self-pay | Admitting: Internal Medicine

## 2022-04-29 ENCOUNTER — Ambulatory Visit (HOSPITAL_COMMUNITY): Payer: PRIVATE HEALTH INSURANCE | Admitting: Anesthesiology

## 2022-04-29 ENCOUNTER — Ambulatory Visit (HOSPITAL_COMMUNITY)
Admission: RE | Admit: 2022-04-29 | Discharge: 2022-04-29 | Disposition: A | Discharge status: Home / Self Care | Payer: PRIVATE HEALTH INSURANCE | Attending: Internal Medicine | Admitting: Internal Medicine

## 2022-04-29 ENCOUNTER — Ambulatory Visit (HOSPITAL_BASED_OUTPATIENT_CLINIC_OR_DEPARTMENT_OTHER): Payer: PRIVATE HEALTH INSURANCE | Admitting: Anesthesiology

## 2022-04-29 ENCOUNTER — Encounter (HOSPITAL_COMMUNITY)
Admission: RE | Disposition: A | Discharge status: Home / Self Care | Payer: Self-pay | Source: Home / Self Care | Attending: Internal Medicine

## 2022-04-29 DIAGNOSIS — E785 Hyperlipidemia, unspecified: Secondary | ICD-10-CM

## 2022-04-29 DIAGNOSIS — Z1211 Encounter for screening for malignant neoplasm of colon: Secondary | ICD-10-CM | POA: Insufficient documentation

## 2022-04-29 DIAGNOSIS — K635 Polyp of colon: Secondary | ICD-10-CM

## 2022-04-29 DIAGNOSIS — I10 Essential (primary) hypertension: Secondary | ICD-10-CM | POA: Diagnosis not present

## 2022-04-29 DIAGNOSIS — D122 Benign neoplasm of ascending colon: Secondary | ICD-10-CM | POA: Insufficient documentation

## 2022-04-29 DIAGNOSIS — R079 Chest pain, unspecified: Secondary | ICD-10-CM

## 2022-04-29 HISTORY — PX: POLYPECTOMY: SHX5525

## 2022-04-29 HISTORY — PX: COLONOSCOPY WITH PROPOFOL: SHX5780

## 2022-04-29 SURGERY — COLONOSCOPY WITH PROPOFOL
Anesthesia: General

## 2022-04-29 MED ORDER — PHENYLEPHRINE HCL (PRESSORS) 10 MG/ML IV SOLN
INTRAVENOUS | Status: DC | PRN
  Administered 2022-04-29: 40 ug via INTRAVENOUS
  Administered 2022-04-29: 80 ug via INTRAVENOUS

## 2022-04-29 MED ORDER — LACTATED RINGERS IV SOLN
INTRAVENOUS | Status: DC
  Administered 2022-04-29: 1000 mL via INTRAVENOUS

## 2022-04-29 MED ORDER — PHENYLEPHRINE HCL (PRESSORS) 10 MG/ML IV SOLN
INTRAVENOUS | Status: AC
  Filled 2022-04-29: qty 1

## 2022-04-29 MED ORDER — PROPOFOL 10 MG/ML IV BOLUS
INTRAVENOUS | Status: DC | PRN
  Administered 2022-04-29: 60 mg via INTRAVENOUS

## 2022-04-29 MED ORDER — PROPOFOL 500 MG/50ML IV EMUL
INTRAVENOUS | Status: DC | PRN
  Administered 2022-04-29: 150 ug/kg/min via INTRAVENOUS

## 2022-04-29 MED ORDER — PROPOFOL 500 MG/50ML IV EMUL
INTRAVENOUS | Status: AC
  Filled 2022-04-29: qty 50

## 2022-04-29 MED ORDER — SODIUM CHLORIDE (PF) 0.9 % IJ SOLN
INTRAMUSCULAR | Status: AC
  Filled 2022-04-29: qty 10

## 2022-04-29 NOTE — Transfer of Care (Signed)
Immediate Anesthesia Transfer of Care Note ? ?Patient: Howard Conner ? ?Procedure(s) Performed: COLONOSCOPY WITH PROPOFOL ?POLYPECTOMY ? ?Patient Location: PACU ? ?Anesthesia Type:General ? ?Level of Consciousness: awake, alert  and oriented ? ?Airway & Oxygen Therapy: Patient Spontanous Breathing ? ?Post-op Assessment: Report given to RN, Post -op Vital signs reviewed and stable, Patient moving all extremities X 4 and Patient able to stick tongue midline ? ?Post vital signs: Reviewed ? ?Last Vitals:  ?Vitals Value Taken Time  ?BP 99/55   ?Temp 97.4   ?Pulse 51   ?Resp 17   ?SpO2 97   ? ? ?Last Pain:  ?Vitals:  ? 04/29/22 1038  ?TempSrc:   ?PainSc: 0-No pain  ?   ? ?Patients Stated Pain Goal: 8 (04/29/22 0948) ? ?Complications: No notable events documented. ?

## 2022-04-29 NOTE — Anesthesia Preprocedure Evaluation (Addendum)
Anesthesia Evaluation  ?Patient identified by MRN, date of birth, ID band ?Patient awake ? ? ? ?Reviewed: ?Allergy & Precautions, NPO status , Patient's Chart, lab work & pertinent test results ? ?Airway ?Mallampati: III ? ?TM Distance: >3 FB ?Neck ROM: Full ? ? ? Dental ? ?(+) Dental Advisory Given, Teeth Intact ?  ?Pulmonary ?neg pulmonary ROS,  ?  ?Pulmonary exam normal ?breath sounds clear to auscultation ? ? ? ? ? ? Cardiovascular ?hypertension, Pt. on medications ?Normal cardiovascular exam ?Rhythm:Regular Rate:Normal ? ? ?  ?Neuro/Psych ?negative neurological ROS ? negative psych ROS  ? GI/Hepatic ?negative GI ROS, Neg liver ROS,   ?Endo/Other  ?negative endocrine ROS ? Renal/GU ?Renal InsufficiencyRenal disease  ?negative genitourinary ?  ?Musculoskeletal ?negative musculoskeletal ROS ?(+)  ? Abdominal ?  ?Peds ?negative pediatric ROS ?(+)  Hematology ?negative hematology ROS ?(+)   ?Anesthesia Other Findings ? ? Reproductive/Obstetrics ?negative OB ROS ? ?  ? ? ? ? ? ? ? ? ? ? ? ? ? ?  ?  ? ? ? ? ? ? ? ?Anesthesia Physical ?Anesthesia Plan ? ?ASA: 2 ? ?Anesthesia Plan: General  ? ?Post-op Pain Management: Minimal or no pain anticipated  ? ?Induction: Intravenous ? ?PONV Risk Score and Plan: Propofol infusion ? ?Airway Management Planned: Nasal Cannula and Natural Airway ? ?Additional Equipment:  ? ?Intra-op Plan:  ? ?Post-operative Plan:  ? ?Informed Consent: I have reviewed the patients History and Physical, chart, labs and discussed the procedure including the risks, benefits and alternatives for the proposed anesthesia with the patient or authorized representative who has indicated his/her understanding and acceptance.  ? ? ? ?Dental advisory given ? ?Plan Discussed with: CRNA and Surgeon ? ?Anesthesia Plan Comments:   ? ? ? ? ? ? ?Anesthesia Quick Evaluation ? ?

## 2022-04-29 NOTE — Anesthesia Postprocedure Evaluation (Signed)
Anesthesia Post Note ? ?Patient: Howard Conner ? ?Procedure(s) Performed: COLONOSCOPY WITH PROPOFOL ?POLYPECTOMY ? ?Patient location during evaluation: Endoscopy ?Anesthesia Type: General ?Level of consciousness: awake and alert and oriented ?Pain management: pain level controlled ?Vital Signs Assessment: post-procedure vital signs reviewed and stable ?Respiratory status: spontaneous breathing, nonlabored ventilation and respiratory function stable ?Cardiovascular status: blood pressure returned to baseline and stable ?Postop Assessment: no apparent nausea or vomiting ?Anesthetic complications: no ? ? ?No notable events documented. ? ? ?Last Vitals:  ?Vitals:  ? 04/29/22 0948 04/29/22 1107  ?BP: 111/84 (!) 99/55  ?Pulse: 65 (!) 52  ?Resp: 18 17  ?Temp: 36.7 ?C (!) 36.3 ?C  ?SpO2: 96% 96%  ?  ?Last Pain:  ?Vitals:  ? 04/29/22 1107  ?TempSrc: Oral  ?PainSc: 0-No pain  ? ? ?  ?  ?  ?  ?  ?  ? ?Makinley Muscato C Hallel Denherder ? ? ? ? ?

## 2022-04-29 NOTE — Op Note (Signed)
[facility] ?Patient Name: Howard Conner ?Procedure Date: 04/29/2022 10:37 AM ?MRN: [id number] ?Date of Birth: [birth year] ?Attending MD: Norvel Richards , MD ?CSN: [id number] ?Age: 56 ?Admit Type: Outpatient ?Procedure:                Colonoscopy ?Indications:              Screening for colorectal malignant neoplasm ?Providers:                Norvel Richards, MD, Janeece Riggers, RN, Tammy  ?                          Vaught, RN ?Referring MD:              ?Medicines:                Propofol per Anesthesia ?Complications:            No immediate complications. ?Estimated Blood Loss:     Estimated blood loss was minimal. ?Procedure:                After obtaining informed consent, the colonoscope  ?                          was passed under direct vision. Throughout the  ?                          procedure, the patient's blood pressure, pulse, and  ?                          oxygen saturations were monitored continuously. The  ?                          [id number]) scope was introduced through the  ?                          anus and advanced to the the cecum, identified by  ?                          appendiceal orifice and ileocecal valve. The  ?                          colonoscopy was performed without difficulty. The  ?                          patient tolerated the procedure well. The quality  ?                          of the bowel preparation was adequate. ?Scope In: 10:50:53 AM ?Scope Out: 11:03:45 AM ?Scope Withdrawal Time: 0 hours 10 minutes 9 seconds  ?Total Procedure Duration: 0 hours 12 minutes 52 seconds  ?Findings: ?     The perianal and digital rectal examinations were normal. ?     Four sessile polyps were found in the ascending colon. The polyps were 4  ?     to 5 mm in size. These polyps were removed with a cold snare. Resection  ?     and retrieval were complete. Estimated blood loss  was minimal. ?     The exam was otherwise without abnormality on direct and retroflexion  ?      views. ?Impression:               - Four 4 to 5 mm polyps in the ascending colon,  ?                          removed with a cold snare. Resected and retrieved. ?                          - The examination was otherwise normal on direct  ?                          and retroflexion views. ?Moderate Sedation: ?     Moderate (conscious) sedation was personally administered by an  ?     anesthesia professional. The following parameters were monitored: oxygen  ?     saturation, heart rate, blood pressure, respiratory rate, EKG, adequacy  ?     of pulmonary ventilation, and response to care. ?Recommendation:           - Patient has a contact number available for  ?                          emergencies. The signs and symptoms of potential  ?                          delayed complications were discussed with the  ?                          patient. Return to normal activities tomorrow.  ?                          Written discharge instructions were provided to the  ?                          patient. ?                          - Resume previous diet. ?                          - Continue present medications. ?                          - Repeat colonoscopy date to be determined after  ?                          pending pathology results are reviewed for  ?                          surveillance. ?                          - Return to GI office (date not yet determined). ?Procedure Code(s):        --- Professional --- ?  45385, Colonoscopy, flexible; with removal of  ?                          tumor(s), polyp(s), or other lesion(s) by snare  ?                          technique ?Diagnosis Code(s):        --- Professional --- ?                          Z12.11, Encounter for screening for malignant  ?                          neoplasm of colon ?                          K63.5, Polyp of colon ?CPT copyright 2019 American Medical Association. All rights reserved. ?The codes documented in this report are  preliminary and upon coder review may  ?be revised to meet current compliance requirements. ?Cristopher Estimable. Dashanique Brownstein, MD ?Norvel Richards, MD ?04/29/2022 11:12:42 AM ?This report has been signed electronically. ?Number of Addenda: 0 ?

## 2022-04-29 NOTE — H&P (Signed)
$'@LOGO'I$ @ ? ? ?Primary Care Physician:  Redmond School, MD ?Primary Gastroenterologist:  Dr.  Marland Kitchen ?Pre-Procedure History & Physical: ?HPI:  Howard Conner is a 56 y.o. male is here for a screening colonoscopy.  No prior colonoscopy.  No GI symptoms.  No family history of colon cancer. ? ?Past Medical History:  ?Diagnosis Date  ? Hyperlipidemia   ? Hypertension   ? Kidney stone   ? ? ?Past Surgical History:  ?Procedure Laterality Date  ? KIDNEY STONE SURGERY    ? VASECTOMY    ? ? ?Prior to Admission medications   ?Medication Sig Start Date End Date Taking? Authorizing Provider  ?amLODipine (NORVASC) 10 MG tablet Take 10 mg by mouth daily.   Yes [provider]  ?cetirizine (ZYRTEC) 10 MG tablet Take 10 mg by mouth daily as needed for allergies.   Yes [provider]  ?Coenzyme Q10 (COQ-10) 100 MG CAPS Take 100 mg by mouth daily.   Yes [provider]  ?fluticasone (FLONASE) 50 MCG/ACT nasal spray Place 1 spray into both nostrils daily as needed for allergies or rhinitis.   Yes [provider]  ?ibuprofen (ADVIL,MOTRIN) 200 MG tablet Take 400 mg by mouth as needed for moderate pain or headache.   Yes [provider]  ?losartan (COZAAR) 50 MG tablet Take 1 tablet (50 mg total) by mouth daily. 03/04/22 06/02/22 Yes Fay Records, MD  ?Omega-3 Fatty Acids (FISH OIL PO) Take 1 capsule by mouth daily.   Yes [provider]  ?rosuvastatin (CRESTOR) 20 MG tablet Take 20 mg by mouth daily.   Yes [provider]  ? ? ?Allergies as of 04/13/2022  ? (No Known Allergies)  ? ? ?Family History  ?Problem Relation Age of Onset  ? Lymphoma Father   ? CAD Maternal Grandfather   ? CAD Paternal Grandfather   ? ? ?Social History  ? ?Socioeconomic History  ? Marital status: Married  ?  Spouse name: Not on file  ? Number of children: Not on file  ? Years of education: Not on file  ? Highest education level: Not on file  ?Occupational History  ? Not on file  ?Tobacco Use  ?  Smoking status: Never  ? Smokeless tobacco: Never  ?Vaping Use  ? Vaping Use: Never used  ?Substance and Sexual Activity  ? Alcohol use: No  ? Drug use: No  ? Sexual activity: Not on file  ?Other Topics Concern  ? Not on file  ?Social History Narrative  ? Not on file  ? ?Social Determinants of Health  ? ?Financial Resource Strain: Not on file  ?Food Insecurity: Not on file  ?Transportation Needs: Not on file  ?Physical Activity: Not on file  ?Stress: Not on file  ?Social Connections: Not on file  ?Intimate Partner Violence: Not on file  ? ? ?Review of Systems: ?See HPI, otherwise negative ROS ? ?Physical Exam: ?BP 111/84   Pulse 65   Temp 98 ?F (36.7 ?C) (Oral)   Resp 18   Ht '5\' 8"'$  (1.727 m)   Wt 93 kg   SpO2 96%   BMI 31.17 kg/m?  ?General:   Alert,  Well-developed, well-nourished, pleasant and cooperative in NAD ?Lungs:  Clear throughout to auscultation.   No wheezes, crackles, or rhonchi. No acute distress. ?Heart:  Regular rate and rhythm; no murmurs, clicks, rubs,  or gallops. ?Abdomen:  Soft, nontender and nondistended. No masses, hepatosplenomegaly or hernias noted. Normal bowel sounds, without guarding, and without  rebound.   ? ?Impression/Plan: ?Howard Conner is now here to undergo a screening colonoscopy.  First ever average risk screening colonoscopy. ? ?Risks, benefits, limitations, imponderables and alternatives regarding colonoscopy have been reviewed with the patient. Questions have been answered. All parties agreeable.  ? ? ? ?Notice:  This dictation was prepared with Dragon dictation along with smaller phrase technology. Any transcriptional errors that result from this process are unintentional and may not be corrected upon review.  ? ?

## 2022-04-29 NOTE — Discharge Instructions (Addendum)
?  Colonoscopy ?Discharge Instructions ? ?Read the instructions outlined below and refer to this sheet in the next few weeks. These discharge instructions provide you with general information on caring for yourself after you leave the hospital. Your doctor may also give you specific instructions. While your treatment has been planned according to the most current medical practices available, unavoidable complications occasionally occur. If you have any problems or questions after discharge, call Dr. Gala Romney at [phone]. ?ACTIVITY ?You may resume your regular activity, but move at a slower pace for the next 24 hours.  ?Take frequent rest periods for the next 24 hours.  ?Walking will help get rid of the air and reduce the bloated feeling in your belly (abdomen).  ?No driving for 24 hours (because of the medicine (anesthesia) used during the test).   ?Do not sign any important legal documents or operate any machinery for 24 hours (because of the anesthesia used during the test).  ?NUTRITION ?Drink plenty of fluids.  ?You may resume your normal diet as instructed by your doctor.  ?Begin with a light meal and progress to your normal diet. Heavy or fried foods are harder to digest and may make you feel sick to your stomach (nauseated).  ?Avoid alcoholic beverages for 24 hours or as instructed.  ?MEDICATIONS ?You may resume your normal medications unless your doctor tells you otherwise.  ?WHAT YOU CAN EXPECT TODAY ?Some feelings of bloating in the abdomen.  ?Passage of more gas than usual.  ?Spotting of blood in your stool or on the toilet paper.  ?IF YOU HAD POLYPS REMOVED DURING THE COLONOSCOPY: ?No aspirin products for 7 days or as instructed.  ?No alcohol for 7 days or as instructed.  ?Eat a soft diet for the next 24 hours.  ?FINDING OUT THE RESULTS OF YOUR TEST ?Not all test results are available during your visit. If your test results are not back during the visit, make an appointment with your caregiver to find out the  results. Do not assume everything is normal if you have not heard from your caregiver or the medical facility. It is important for you to follow up on all of your test results.  ?SEEK IMMEDIATE MEDICAL ATTENTION IF: ?You have more than a spotting of blood in your stool.  ?Your belly is swollen (abdominal distention).  ?You are nauseated or vomiting.  ?You have a temperature over 101.  ?You have abdominal pain or discomfort that is severe or gets worse throughout the day.   ? ? ?4 polyps removed from your colon today ? ?Further recommendations to follow pending review of pathology report ? ?At patient request, I called Leeann at [phone] findings and recommendations ? ?

## 2022-04-30 LAB — SURGICAL PATHOLOGY

## 2022-05-05 ENCOUNTER — Encounter (HOSPITAL_COMMUNITY): Payer: Self-pay | Admitting: Internal Medicine

## 2022-05-05 ENCOUNTER — Encounter: Payer: Self-pay | Admitting: Internal Medicine

## 2022-07-24 ENCOUNTER — Other Ambulatory Visit (HOSPITAL_COMMUNITY): Payer: Self-pay | Admitting: Internal Medicine

## 2022-07-24 ENCOUNTER — Ambulatory Visit (HOSPITAL_COMMUNITY)
Admission: RE | Admit: 2022-07-24 | Discharge: 2022-07-24 | Disposition: A | Discharge status: Home / Self Care | Payer: PRIVATE HEALTH INSURANCE | Source: Ambulatory Visit | Attending: Internal Medicine | Admitting: Internal Medicine

## 2022-07-24 DIAGNOSIS — R051 Acute cough: Secondary | ICD-10-CM

## 2022-11-30 ENCOUNTER — Ambulatory Visit
Admission: RE | Admit: 2022-11-30 | Discharge: 2022-11-30 | Disposition: A | Discharge status: Home / Self Care | Payer: PRIVATE HEALTH INSURANCE | Source: Ambulatory Visit | Attending: Nurse Practitioner | Admitting: Nurse Practitioner

## 2022-11-30 VITALS — BP 110/74 | HR 92 | Temp 99.3°F | Resp 16

## 2022-11-30 DIAGNOSIS — J069 Acute upper respiratory infection, unspecified: Secondary | ICD-10-CM | POA: Diagnosis present

## 2022-11-30 DIAGNOSIS — B349 Viral infection, unspecified: Secondary | ICD-10-CM | POA: Diagnosis present

## 2022-11-30 DIAGNOSIS — Z1152 Encounter for screening for COVID-19: Secondary | ICD-10-CM | POA: Insufficient documentation

## 2022-11-30 MED ORDER — FLUTICASONE PROPIONATE 50 MCG/ACT NA SUSP: 2.0000 | Freq: Every day | NASAL | 0 refills | Status: AC

## 2022-11-30 MED ORDER — OSELTAMIVIR PHOSPHATE 75 MG PO CAPS: 75.0000 mg | ORAL_CAPSULE | Freq: Two times a day (BID) | ORAL | 0 refills | Status: DC

## 2022-11-30 MED ORDER — BENZONATATE 100 MG PO CAPS: 100.0000 mg | ORAL_CAPSULE | Freq: Three times a day (TID) | ORAL | 0 refills | Status: DC | PRN

## 2022-11-30 MED ORDER — PSEUDOEPH-BROMPHEN-DM 30-2-10 MG/5ML PO SYRP: 5.0000 mL | ORAL_SOLUTION | Freq: Four times a day (QID) | ORAL | 0 refills | Status: DC | PRN

## 2022-11-30 NOTE — Discharge Instructions (Addendum)
COVID/flu test is pending. You will be contacted if the pending test results are positive. If the COVID test is positive, you are a candidate to receive molnupiravir as antiviral therapy. If the influenza test is negative, you can stop the Tamiflu. Take medication as prescribed. Increase fluids and allow for plenty of rest. Recommend Tylenol or ibuprofen as needed for pain, fever, or general discomfort. Recommend using a humidifier at bedtime during sleep to help with cough and nasal congestion. Sleep elevated on 2 pillows while cough symptoms persist.. As discussed, a viral illness may last anywhere from 10 to 14 days.  If symptoms continue to persist beyond that time or if they suddenly worsen, please follow-up with your primary care physician for further evaluation. Follow-up as needed.

## 2022-11-30 NOTE — ED Provider Notes (Signed)
[facility]    CSN: [id number] Arrival date & time: 11/30/22  1643      History   Chief Complaint Chief Complaint  Patient presents with   Fever    Running a 100-102 temp since Sat, cough & severe body aches; Covid test was negative - Entered by patient   Cough   Generalized Body Aches    HPI Howard Conner is a 56 y.o. male.   The history is provided by the patient and the spouse.   The patient presents with his spouse for complaints of flulike symptoms started started over the past 48 hours.  Patient complains of fever, body aches, chills, and cough.  Patient denies headache, sore throat, wheezing, shortness of breath, difficulty breathing, or GI symptoms.  Patient denies any obvious known sick contacts.  He reports he has been taking Tylenol cold and flu medication for his symptoms along with NyQuil.  He has also been taking ibuprofen.  Patient reports that he has not been vaccinated for influenza.  Past Medical History:  Diagnosis Date   Hyperlipidemia    Hypertension    Kidney stone     Patient Active Problem List   Diagnosis Date Noted   Essential hypertension 01/20/2017   Hyperlipidemia 01/20/2017   Chest pain 01/20/2017    Past Surgical History:  Procedure Laterality Date   COLONOSCOPY WITH PROPOFOL N/A 04/29/2022   Procedure: COLONOSCOPY WITH PROPOFOL;  Surgeon: Daneil Dolin, MD;  Location: [facility];  Service: Endoscopy;  Laterality: N/A;  10:45 / ASA 2   KIDNEY STONE SURGERY     POLYPECTOMY  04/29/2022   Procedure: POLYPECTOMY;  Surgeon: Daneil Dolin, MD;  Location: [facility];  Service: Endoscopy;;   VASECTOMY         Home Medications    Prior to Admission medications   Medication Sig Start Date End Date Taking? Authorizing Provider  amLODipine (NORVASC) 10 MG tablet Take 10 mg by mouth daily.   Yes [provider]  benzonatate (TESSALON PERLES) 100 MG capsule Take 1 capsule (100 mg total) by mouth 3 (three)  times daily as needed for cough. 11/30/22  Yes Samil Mecham-Warren, Alda Lea, NP  Coenzyme Q10 (COQ-10) 100 MG CAPS Take 100 mg by mouth daily.   Yes [provider]  fluticasone (FLONASE) 50 MCG/ACT nasal spray Place 2 sprays into both nostrils daily. 11/30/22  Yes Kerwin Augustus-Warren, Alda Lea, NP  ibuprofen (ADVIL,MOTRIN) 200 MG tablet Take 400 mg by mouth as needed for moderate pain or headache.   Yes [provider]  Omega-3 Fatty Acids (FISH OIL PO) Take 1 capsule by mouth daily.   Yes [provider]  oseltamivir (TAMIFLU) 75 MG capsule Take 1 capsule (75 mg total) by mouth every 12 (twelve) hours. 11/30/22  Yes Britanni Yarde-Warren, Alda Lea, NP  rosuvastatin (CRESTOR) 20 MG tablet Take 20 mg by mouth daily.   Yes [provider]  cetirizine (ZYRTEC) 10 MG tablet Take 10 mg by mouth daily as needed for allergies.    [provider]  losartan (COZAAR) 50 MG tablet Take 1 tablet (50 mg total) by mouth daily. 03/04/22 06/02/22  Fay Records, MD    Family History Family History  Problem Relation Age of Onset   Lymphoma Father    CAD Maternal Grandfather    CAD Paternal Grandfather     Social History Social History   Tobacco Use   Smoking status: Never   Smokeless tobacco: Never  Vaping Use   Vaping Use: Never used  Substance Use Topics   Alcohol use: No   Drug use: No     Allergies   Patient has no known allergies.   Review of Systems Review of Systems Per HPI  Physical Exam Triage Vital Signs ED Triage Vitals  Enc Vitals Group     BP 11/30/22 1730 110/74     Pulse Rate 11/30/22 1730 92     Resp 11/30/22 1730 16     Temp 11/30/22 1730 99.3 F (37.4 C)     Temp Source 11/30/22 1730 Oral     SpO2 11/30/22 1730 95 %     Weight --      Height --      Head Circumference --      Peak Flow --      Pain Score 11/30/22 1731 2     Pain Loc --      Pain Edu? --      Excl. in [place]? --    No data found.  Updated Vital Signs BP 110/74  (BP Location: Right Arm)   Pulse 92   Temp 99.3 F (37.4 C) (Oral)   Resp 16   SpO2 95%   Visual Acuity Right Eye Distance:   Left Eye Distance:   Bilateral Distance:    Right Eye Near:   Left Eye Near:    Bilateral Near:     Physical Exam Vitals and nursing note reviewed.  Constitutional:      General: He is not in acute distress.    Appearance: Normal appearance.  HENT:     Head: Normocephalic.     Right Ear: Tympanic membrane, ear canal and external ear normal.     Left Ear: Tympanic membrane, ear canal and external ear normal.     Nose: Congestion and rhinorrhea present.     Mouth/Throat:     Mouth: Mucous membranes are moist.     Pharynx: No posterior oropharyngeal erythema.  Eyes:     Extraocular Movements: Extraocular movements intact.     Conjunctiva/sclera: Conjunctivae normal.     Pupils: Pupils are equal, round, and reactive to light.  Cardiovascular:     Rate and Rhythm: Regular rhythm.     Pulses: Normal pulses.     Heart sounds: Normal heart sounds.  Pulmonary:     Effort: Pulmonary effort is normal.     Breath sounds: Normal breath sounds.  Abdominal:     General: Bowel sounds are normal.     Palpations: Abdomen is soft.  Musculoskeletal:     Cervical back: Normal range of motion.  Lymphadenopathy:     Cervical: No cervical adenopathy.  Skin:    General: Skin is warm and dry.  Neurological:     General: No focal deficit present.     Mental Status: He is alert and oriented to person, place, and time.  Psychiatric:        Mood and Affect: Mood normal.        Behavior: Behavior normal.      UC Treatments / Results  Labs (all labs ordered are listed, but only abnormal results are displayed) Labs Reviewed  RESP PANEL BY RT-PCR (FLU A&B, COVID) [id number]    EKG   Radiology No results found.  Procedures Procedures (including critical care time)  Medications Ordered in UC Medications - No data to display  Initial Impression /  Assessment and Plan / UC Course  I have reviewed the  triage vital signs and the nursing notes.  Pertinent labs & imaging results that were available during my care of the patient were reviewed by me and considered in my medical decision making (see chart for details).  The patient is well-appearing, he is in no acute distress, vital signs are stable.  Symptoms appear to be consistent with a viral illness at this time.  COVID/flu test is pending.  Patient is a candidate to receive molnupiravir if his COVID test is positive.  Patient has been started on Tamiflu 75 mg for flulike symptoms.  Patient was also prescribed benzonatate 100 mg for his cough and fluticasone 50 mcg nasal spray for his nasal congestion.  Supportive care recommendations were provided to the patient to include increasing fluids and allowing for plenty of rest.  Patient is also advised to stop the Tamiflu if his flu test is negative.  Patient and spouse verbalized understanding.  All questions were answered.  Patient is stable for discharge.   Final Clinical Impressions(s) / UC Diagnoses   Final diagnoses:  Viral illness  Viral upper respiratory tract infection with cough  Encounter for screening for COVID-19     Discharge Instructions      COVID/flu test is pending. You will be contacted if the pending test results are positive. If the COVID test is positive, you are a candidate to receive molnupiravir as antiviral therapy. If the influenza test is negative, you can stop the Tamiflu. Take medication as prescribed. Increase fluids and allow for plenty of rest. Recommend Tylenol or ibuprofen as needed for pain, fever, or general discomfort. Recommend using a humidifier at bedtime during sleep to help with cough and nasal congestion. Sleep elevated on 2 pillows while cough symptoms persist.. As discussed, a viral illness may last anywhere from 10 to 14 days.  If symptoms continue to persist beyond that time or if they  suddenly worsen, please follow-up with your primary care physician for further evaluation. Follow-up as needed.     ED Prescriptions     Medication Sig Dispense Auth. Provider   oseltamivir (TAMIFLU) 75 MG capsule Take 1 capsule (75 mg total) by mouth every 12 (twelve) hours. 10 capsule Leshawn Straka-Warren, Alda Lea, NP   fluticasone (FLONASE) 50 MCG/ACT nasal spray Place 2 sprays into both nostrils daily. 16 g Nolawi Kanady-Warren, Alda Lea, NP   brompheniramine-pseudoephedrine-DM 30-2-10 MG/5ML syrup  (Status: Discontinued) Take 5 mLs by mouth 4 (four) times daily as needed. 140 mL Breyana Follansbee-Warren, Alda Lea, NP   benzonatate (TESSALON PERLES) 100 MG capsule Take 1 capsule (100 mg total) by mouth 3 (three) times daily as needed for cough. 30 capsule Drisana Schweickert-Warren, Alda Lea, NP      PDMP not reviewed this encounter.   Tish Men, NP 11/30/22 1753

## 2022-11-30 NOTE — ED Triage Notes (Signed)
Fever since Saturday afternoon, with body aches, chills, and cough. Taking tylenol around lunch time today, also nyquil cold and flu, and ibuprofen.

## 2022-12-01 LAB — RESP PANEL BY RT-PCR (FLU A&B, COVID) ARPGX2
Influenza A by PCR: POSITIVE — AB
Influenza B by PCR: NEGATIVE
SARS Coronavirus 2 by RT PCR: NEGATIVE

## 2023-04-06 ENCOUNTER — Other Ambulatory Visit: Payer: Self-pay | Admitting: Internal Medicine

## 2023-08-01 ENCOUNTER — Telehealth: Payer: PRIVATE HEALTH INSURANCE | Admitting: Family Medicine

## 2023-08-01 DIAGNOSIS — U071 COVID-19: Secondary | ICD-10-CM | POA: Diagnosis not present

## 2023-08-01 MED ORDER — NIRMATRELVIR/RITONAVIR (PAXLOVID)TABLET: 3.0000 | ORAL_TABLET | Freq: Two times a day (BID) | ORAL | 0 refills | Status: AC

## 2023-08-01 NOTE — Patient Instructions (Signed)

## 2023-08-01 NOTE — Progress Notes (Signed)
Virtual Visit Consent   Howard Conner, you are scheduled for a virtual visit with a [facility] provider today. Just as with appointments in the office, your consent must be obtained to participate. Your consent will be active for this visit and any virtual visit you may have with one of our providers in the next 365 days. If you have a MyChart account, a copy of this consent can be sent to you electronically.  As this is a virtual visit, video technology does not allow for your provider to perform a traditional examination. This may limit your provider's ability to fully assess your condition. If your provider identifies any concerns that need to be evaluated in person or the need to arrange testing (such as labs, EKG, etc.), we will make arrangements to do so. Although advances in technology are sophisticated, we cannot ensure that it will always work on either your end or our end. If the connection with a video visit is poor, the visit may have to be switched to a telephone visit. With either a video or telephone visit, we are not always able to ensure that we have a secure connection.  By engaging in this virtual visit, you consent to the provision of healthcare and authorize for your insurance to be billed (if applicable) for the services provided during this visit. Depending on your insurance coverage, you may receive a charge related to this service.  I need to obtain your verbal consent now. Are you willing to proceed with your visit today? Howard Conner has provided verbal consent on 08/01/2023 for a virtual visit (video or telephone). Howard Curio, FNP  Date: 08/01/2023 10:29 AM  Virtual Visit via Video Note   I, Howard Conner, connected with  Howard Conner  ([id number], [birth year]) on 08/01/23 at 10:45 AM EDT by a video-enabled telemedicine application and verified that I am speaking with the correct person using two identifiers.  Location: Patient: Virtual Visit Location  Patient: Home Provider: Virtual Visit Location Provider: Home Office   I discussed the limitations of evaluation and management by telemedicine and the availability of in person appointments. The patient expressed understanding and agreed to proceed.    History of Present Illness: Howard Conner is a 57 y.o. who identifies as a male who was assigned male at birth, and is being seen today for positive covid test this am. He took at home test and it was positive. He has head congestion, sore throat, body aches. No fever wheezing or sob. He is chronic health conditions and has taken paxlovid before. No renal impairment per pt.   HPI: HPI  Problems:  Patient Active Problem List   Diagnosis Date Noted   Essential hypertension 01/20/2017   Hyperlipidemia 01/20/2017   Chest pain 01/20/2017    Allergies: No Known Allergies Medications:  Current Outpatient Medications:    amLODipine (NORVASC) 10 MG tablet, Take 10 mg by mouth daily., Disp: , Rfl:    benzonatate (TESSALON PERLES) 100 MG capsule, Take 1 capsule (100 mg total) by mouth 3 (three) times daily as needed for cough., Disp: 30 capsule, Rfl: 0   cetirizine (ZYRTEC) 10 MG tablet, Take 10 mg by mouth daily as needed for allergies., Disp: , Rfl:    Coenzyme Q10 (COQ-10) 100 MG CAPS, Take 100 mg by mouth daily., Disp: , Rfl:    fluticasone (FLONASE) 50 MCG/ACT nasal spray, Place 2 sprays into both nostrils daily., Disp: 16 g, Rfl: 0   ibuprofen (ADVIL,MOTRIN) 200  MG tablet, Take 400 mg by mouth as needed for moderate pain or headache., Disp: , Rfl:    losartan (COZAAR) 50 MG tablet, TAKE ONE TABLET (50MG  TOTAL) BY MOUTH DAILY, Disp: 90 tablet, Rfl: 3   Omega-3 Fatty Acids (FISH OIL PO), Take 1 capsule by mouth daily., Disp: , Rfl:    oseltamivir (TAMIFLU) 75 MG capsule, Take 1 capsule (75 mg total) by mouth every 12 (twelve) hours., Disp: 10 capsule, Rfl: 0   rosuvastatin (CRESTOR) 20 MG tablet, Take 20 mg by mouth daily., Disp: , Rfl:    Observations/Objective: Patient is well-developed, well-nourished in no acute distress.  Resting comfortably  at home.  Head is normocephalic, atraumatic.  No labored breathing.  Speech is clear and coherent with logical content.  Patient is alert and oriented at baseline.    Assessment and Plan: 1. COVID  Increase fluids, humidifier, tylenol or ibuprofen, Vit D and zinc, quarantine discussed. UC if sx persist or worsen.   Follow Up Instructions: I discussed the assessment and treatment plan with the patient. The patient was provided an opportunity to ask questions and all were answered. The patient agreed with the plan and demonstrated an understanding of the instructions.  A copy of instructions were sent to the patient via MyChart unless otherwise noted below.     The patient was advised to call back or seek an in-person evaluation if the symptoms worsen or if the condition fails to improve as anticipated.  Time:  I spent 10 minutes with the patient via telehealth technology discussing the above problems/concerns.    Howard Curio, FNP

## 2024-06-19 ENCOUNTER — Ambulatory Visit
Admission: RE | Admit: 2024-06-19 | Discharge: 2024-06-19 | Disposition: A | Discharge status: Home / Self Care | Payer: PRIVATE HEALTH INSURANCE | Source: Ambulatory Visit | Attending: Family Medicine | Admitting: Family Medicine

## 2024-06-19 VITALS — BP 118/85 | HR 93 | Temp 99.0°F | Resp 18

## 2024-06-19 DIAGNOSIS — J069 Acute upper respiratory infection, unspecified: Secondary | ICD-10-CM

## 2024-06-19 LAB — POC COVID19/FLU A&B COMBO
Covid Antigen, POC: NEGATIVE
Influenza A Antigen, POC: NEGATIVE
Influenza B Antigen, POC: NEGATIVE

## 2024-06-19 MED ORDER — PROMETHAZINE-DM 6.25-15 MG/5ML PO SYRP: 5.0000 mL | ORAL_SOLUTION | Freq: Four times a day (QID) | ORAL | 0 refills | Status: DC | PRN

## 2024-06-19 MED ORDER — AZELASTINE HCL 0.1 % NA SOLN: 1.0000 | Freq: Two times a day (BID) | NASAL | 0 refills | Status: AC

## 2024-06-19 NOTE — ED Provider Notes (Signed)
 [facility]    CSN: [id number] Arrival date & time: 06/19/24  1143      History   Chief Complaint Chief Complaint  Patient presents with   Cough    Cough, congestion, fatigue - Entered by patient    HPI Howard Conner is a 58 y.o. male.   Patient presenting today with 4-day history of cough, congestion, sinus drainage, sore throat, low-grade fevers, fatigue.  Denies chest pain, shortness of breath, abdominal pain, vomiting, diarrhea.  So far try Mucinex with minimal relief.  No known history of chronic pulmonary disease.    Past Medical History:  Diagnosis Date   Hyperlipidemia    Hypertension    Kidney stone     Patient Active Problem List   Diagnosis Date Noted   Essential hypertension 01/20/2017   Hyperlipidemia 01/20/2017   Chest pain 01/20/2017    Past Surgical History:  Procedure Laterality Date   COLONOSCOPY WITH PROPOFOL  N/A 04/29/2022   Procedure: COLONOSCOPY WITH PROPOFOL ;  Surgeon: Shaaron Lamar HERO, MD;  Location: [facility];  Service: Endoscopy;  Laterality: N/A;  10:45 / ASA 2   KIDNEY STONE SURGERY     POLYPECTOMY  04/29/2022   Procedure: POLYPECTOMY;  Surgeon: Shaaron Lamar HERO, MD;  Location: [facility];  Service: Endoscopy;;   VASECTOMY         Home Medications    Prior to Admission medications   Medication Sig Start Date End Date Taking? Authorizing Provider  azelastine  (ASTELIN ) 0.1 % nasal spray Place 1 spray into both nostrils 2 (two) times daily. Use in each nostril as directed 06/19/24  Yes Stuart Vernell Norris, PA-C  promethazine -dextromethorphan (PROMETHAZINE -DM) 6.25-15 MG/5ML syrup Take 5 mLs by mouth 4 (four) times daily as needed. 06/19/24  Yes Stuart Vernell Norris, PA-C  amLODipine (NORVASC) 10 MG tablet Take 10 mg by mouth daily.    [provider]  benzonatate  (TESSALON  PERLES) 100 MG capsule Take 1 capsule (100 mg total) by mouth 3 (three) times daily as needed for cough. 11/30/22   Leath-Warren,  Etta PARAS, NP  cetirizine (ZYRTEC) 10 MG tablet Take 10 mg by mouth daily as needed for allergies.    [provider]  Coenzyme Q10 (COQ-10) 100 MG CAPS Take 100 mg by mouth daily.    [provider]  fluticasone  (FLONASE ) 50 MCG/ACT nasal spray Place 2 sprays into both nostrils daily. 11/30/22   Leath-Warren, Etta PARAS, NP  ibuprofen (ADVIL,MOTRIN) 200 MG tablet Take 400 mg by mouth as needed for moderate pain or headache.    [provider]  losartan  (COZAAR ) 50 MG tablet TAKE ONE TABLET (50MG  TOTAL) BY MOUTH DAILY 04/06/23   Okey Vina GAILS, MD  Omega-3 Fatty Acids (FISH OIL PO) Take 1 capsule by mouth daily.    [provider]  oseltamivir  (TAMIFLU ) 75 MG capsule Take 1 capsule (75 mg total) by mouth every 12 (twelve) hours. 11/30/22   Leath-Warren, Etta PARAS, NP  rosuvastatin (CRESTOR) 20 MG tablet Take 20 mg by mouth daily.    [provider]    Family History Family History  Problem Relation Age of Onset   Lymphoma Father    CAD Maternal Grandfather    CAD Paternal Grandfather     Social History Social History   Tobacco Use   Smoking status: Never   Smokeless tobacco: Never  Vaping Use   Vaping status: Never Used  Substance Use Topics   Alcohol use: No   Drug use:  No     Allergies   Patient has no known allergies.   Review of Systems Review of Systems Per HPI  Physical Exam Triage Vital Signs ED Triage Vitals  Encounter Vitals Group     BP 06/19/24 1210 118/85     Girls Systolic BP Percentile --      Girls Diastolic BP Percentile --      Boys Systolic BP Percentile --      Boys Diastolic BP Percentile --      Pulse Rate 06/19/24 1210 93     Resp 06/19/24 1210 18     Temp 06/19/24 1210 99 F (37.2 C)     Temp Source 06/19/24 1210 Oral     SpO2 06/19/24 1210 96 %     Weight --      Height --      Head Circumference --      Peak Flow --      Pain Score 06/19/24 1213 0     Pain Loc --      Pain Education --       Exclude from Growth Chart --    No data found.  Updated Vital Signs BP 118/85 (BP Location: Right Arm)   Pulse 93   Temp 99 F (37.2 C) (Oral)   Resp 18   SpO2 96%   Visual Acuity Right Eye Distance:   Left Eye Distance:   Bilateral Distance:    Right Eye Near:   Left Eye Near:    Bilateral Near:     Physical Exam Vitals and nursing note reviewed.  Constitutional:      Appearance: He is well-developed.  HENT:     Head: Atraumatic.     Right Ear: External ear normal.     Left Ear: External ear normal.     Nose: Rhinorrhea present.     Mouth/Throat:     Pharynx: Posterior oropharyngeal erythema present. No oropharyngeal exudate.  Eyes:     Conjunctiva/sclera: Conjunctivae normal.     Pupils: Pupils are equal, round, and reactive to light.  Cardiovascular:     Rate and Rhythm: Normal rate and regular rhythm.  Pulmonary:     Effort: Pulmonary effort is normal. No respiratory distress.     Breath sounds: No wheezing or rales.  Musculoskeletal:        General: Normal range of motion.     Cervical back: Normal range of motion and neck supple.  Lymphadenopathy:     Cervical: No cervical adenopathy.  Skin:    General: Skin is warm and dry.  Neurological:     Mental Status: He is alert and oriented to person, place, and time.  Psychiatric:        Behavior: Behavior normal.      UC Treatments / Results  Labs (all labs ordered are listed, but only abnormal results are displayed) Labs Reviewed  POC COVID19/FLU A&B COMBO    EKG   Radiology No results found.  Procedures Procedures (including critical care time)  Medications Ordered in UC Medications - No data to display  Initial Impression / Assessment and Plan / UC Course  I have reviewed the triage vital signs and the nursing notes.  Pertinent labs & imaging results that were available during my care of the patient were reviewed by me and considered in my medical decision making (see chart for  details).     Vitals and exam overall reassuring and suspicious for viral respiratory infection.  Rapid  flu and COVID-negative.  Will treat with Astelin , Phenergan  DM, supportive over-the-counter medications and home care.  Return for worsening symptoms.  Final Clinical Impressions(s) / UC Diagnoses   Final diagnoses:  Viral URI with cough   Discharge Instructions   None    ED Prescriptions     Medication Sig Dispense Auth. Provider   azelastine  (ASTELIN ) 0.1 % nasal spray Place 1 spray into both nostrils 2 (two) times daily. Use in each nostril as directed 30 mL Stuart Vernell Norris, PA-C   promethazine -dextromethorphan (PROMETHAZINE -DM) 6.25-15 MG/5ML syrup Take 5 mLs by mouth 4 (four) times daily as needed. 100 mL Stuart Vernell Norris, [place]      PDMP not reviewed this encounter.   Stuart Vernell Norris, [place] 06/19/24 1341

## 2024-06-19 NOTE — ED Triage Notes (Signed)
 Pt reports cough, congestion, sinus drainage, sore throat, low grade fever, and fatigue, x 4 days

## 2024-06-30 ENCOUNTER — Ambulatory Visit
Admission: RE | Admit: 2024-06-30 | Discharge: 2024-06-30 | Disposition: A | Discharge status: Home / Self Care | Payer: PRIVATE HEALTH INSURANCE | Source: Ambulatory Visit | Attending: Nurse Practitioner | Admitting: Nurse Practitioner

## 2024-06-30 VITALS — BP 120/86 | HR 78 | Temp 97.9°F | Resp 16

## 2024-06-30 DIAGNOSIS — H6593 Unspecified nonsuppurative otitis media, bilateral: Secondary | ICD-10-CM

## 2024-06-30 DIAGNOSIS — H6993 Unspecified Eustachian tube disorder, bilateral: Secondary | ICD-10-CM

## 2024-06-30 MED ORDER — CETIRIZINE-PSEUDOEPHEDRINE ER 5-120 MG PO TB12: 1.0000 | ORAL_TABLET | Freq: Two times a day (BID) | ORAL | 0 refills | Status: DC

## 2024-06-30 MED ORDER — PREDNISONE 20 MG PO TABS: 40.0000 mg | ORAL_TABLET | Freq: Every day | ORAL | 0 refills | Status: AC

## 2024-06-30 MED ORDER — DEXAMETHASONE SODIUM PHOSPHATE 10 MG/ML IJ SOLN
10.0000 mg | INTRAMUSCULAR | Status: AC
  Administered 2024-06-30: 10 mg via INTRAMUSCULAR

## 2024-06-30 NOTE — ED Triage Notes (Signed)
Bilateral ear fullness x 2 days

## 2024-06-30 NOTE — Discharge Instructions (Addendum)
 You were given an injection of Decadron 10 mg.  Start the prednisone on 07/01/2024. Take medication as prescribed.  Use the Flonase  you have at home daily as discussed. You may take over-the-counter Tylenol  or ibuprofen as needed for pain, fever, or general discomfort. Apply warm compresses to the ears as needed for pain or discomfort. Do not stick or insert anything inside of the ears while symptoms persist. As discussed, if symptoms fail to improve with this treatment, please follow-up with your primary care physician to discuss possible referral to ENT. Follow-up as needed.

## 2024-06-30 NOTE — ED Provider Notes (Signed)
 [facility]    CSN: [id number] Arrival date & time: 06/30/24  1344      History   Chief Complaint Chief Complaint  Patient presents with   Ear Fullness    Entered by patient    HPI Kairos Panetta is a 58 y.o. male.   The history is provided by the patient.   Patient presents with a 2-day history of bilateral ear fullness and pressure.  Patient states he was treated for URI over the past week or so.  Patient states that he can feel the pressure relieved in his ears when he leans forward.  He states that he has not had fever, chills, ear drainage, lightheadedness, or dizziness.  Patient states he has not used any medications for his symptoms.  Past Medical History:  Diagnosis Date   Hyperlipidemia    Hypertension    Kidney stone     Patient Active Problem List   Diagnosis Date Noted   Essential hypertension 01/20/2017   Hyperlipidemia 01/20/2017   Chest pain 01/20/2017    Past Surgical History:  Procedure Laterality Date   COLONOSCOPY WITH PROPOFOL  N/A 04/29/2022   Procedure: COLONOSCOPY WITH PROPOFOL ;  Surgeon: Shaaron Lamar HERO, MD;  Location: [facility];  Service: Endoscopy;  Laterality: N/A;  10:45 / ASA 2   KIDNEY STONE SURGERY     POLYPECTOMY  04/29/2022   Procedure: POLYPECTOMY;  Surgeon: Shaaron Lamar HERO, MD;  Location: [facility];  Service: Endoscopy;;   VASECTOMY         Home Medications    Prior to Admission medications   Medication Sig Start Date End Date Taking? Authorizing Provider  amLODipine (NORVASC) 10 MG tablet Take 10 mg by mouth daily.   Yes [provider]  cetirizine (ZYRTEC) 10 MG tablet Take 10 mg by mouth daily as needed for allergies.   Yes [provider]  Coenzyme Q10 (COQ-10) 100 MG CAPS Take 100 mg by mouth daily.   Yes [provider]  fluticasone  (FLONASE ) 50 MCG/ACT nasal spray Place 2 sprays into both nostrils daily. 11/30/22  Yes Leath-Warren, Etta PARAS, NP  losartan  (COZAAR ) 50  MG tablet TAKE ONE TABLET (50MG  TOTAL) BY MOUTH DAILY 04/06/23  Yes Okey Vina GAILS, MD  Omega-3 Fatty Acids (FISH OIL PO) Take 1 capsule by mouth daily.   Yes [provider]  promethazine -dextromethorphan (PROMETHAZINE -DM) 6.25-15 MG/5ML syrup Take 5 mLs by mouth 4 (four) times daily as needed. 06/19/24  Yes Stuart Vernell Norris, PA-C  rosuvastatin (CRESTOR) 20 MG tablet Take 20 mg by mouth daily.   Yes [provider]  azelastine  (ASTELIN ) 0.1 % nasal spray Place 1 spray into both nostrils 2 (two) times daily. Use in each nostril as directed 06/19/24   Stuart Vernell Norris, PA-C  benzonatate  (TESSALON  PERLES) 100 MG capsule Take 1 capsule (100 mg total) by mouth 3 (three) times daily as needed for cough. 11/30/22   Leath-Warren, Etta PARAS, NP  ibuprofen (ADVIL,MOTRIN) 200 MG tablet Take 400 mg by mouth as needed for moderate pain or headache.    [provider]  oseltamivir  (TAMIFLU ) 75 MG capsule Take 1 capsule (75 mg total) by mouth every 12 (twelve) hours. 11/30/22   Leath-Warren, Etta PARAS, NP    Family History Family History  Problem Relation Age of Onset   Lymphoma Father    CAD Maternal Grandfather    CAD Paternal Grandfather     Social History Social History   Tobacco Use  Smoking status: Never   Smokeless tobacco: Never  Vaping Use   Vaping status: Never Used  Substance Use Topics   Alcohol use: No   Drug use: No     Allergies   Patient has no known allergies.   Review of Systems Review of Systems Per HPI  Physical Exam Triage Vital Signs ED Triage Vitals  Encounter Vitals Group     BP 06/30/24 1351 120/86     Girls Systolic BP Percentile --      Girls Diastolic BP Percentile --      Boys Systolic BP Percentile --      Boys Diastolic BP Percentile --      Pulse Rate 06/30/24 1351 78     Resp 06/30/24 1351 16     Temp 06/30/24 1351 97.9 F (36.6 C)     Temp Source 06/30/24 1351 Oral     SpO2 06/30/24 1351 95 %     Weight --       Height --      Head Circumference --      Peak Flow --      Pain Score 06/30/24 1352 0     Pain Loc --      Pain Education --      Exclude from Growth Chart --    No data found.  Updated Vital Signs BP 120/86 (BP Location: Right Arm)   Pulse 78   Temp 97.9 F (36.6 C) (Oral)   Resp 16   SpO2 95%   Visual Acuity Right Eye Distance:   Left Eye Distance:   Bilateral Distance:    Right Eye Near:   Left Eye Near:    Bilateral Near:     Physical Exam Vitals and nursing note reviewed.  Constitutional:      General: He is not in acute distress.    Appearance: Normal appearance.  HENT:     Head: Normocephalic.     Right Ear: Ear canal and external ear normal. A middle ear effusion is present.     Left Ear: Ear canal and external ear normal. A middle ear effusion is present.     Nose: Nose normal.     Right Turbinates: Enlarged and swollen.     Left Turbinates: Enlarged and swollen.     Right Sinus: No maxillary sinus tenderness or frontal sinus tenderness.     Left Sinus: No maxillary sinus tenderness or frontal sinus tenderness.     Mouth/Throat:     Lips: Pink.     Mouth: Mucous membranes are moist.     Pharynx: Oropharynx is clear. Uvula midline. Postnasal drip present. No pharyngeal swelling or posterior oropharyngeal erythema.     Comments: Cobblestoning present to posterior oropharynx  Eyes:     Extraocular Movements: Extraocular movements intact.     Conjunctiva/sclera: Conjunctivae normal.     Pupils: Pupils are equal, round, and reactive to light.  Cardiovascular:     Rate and Rhythm: Normal rate and regular rhythm.     Pulses: Normal pulses.     Heart sounds: Normal heart sounds.  Pulmonary:     Effort: Pulmonary effort is normal. No respiratory distress.     Breath sounds: Normal breath sounds. No stridor. No wheezing, rhonchi or rales.  Abdominal:     General: Bowel sounds are normal.     Palpations: Abdomen is soft.     Tenderness: There is no  abdominal tenderness.  Musculoskeletal:     Cervical back:  Normal range of motion.  Skin:    General: Skin is warm and dry.  Neurological:     General: No focal deficit present.     Mental Status: He is alert and oriented to person, place, and time.  Psychiatric:        Mood and Affect: Mood normal.        Behavior: Behavior normal.      UC Treatments / Results  Labs (all labs ordered are listed, but only abnormal results are displayed) Labs Reviewed - No data to display  EKG   Radiology No results found.  Procedures Procedures (including critical care time)  Medications Ordered in UC Medications - No data to display  Initial Impression / Assessment and Plan / UC Course  I have reviewed the triage vital signs and the nursing notes.  Pertinent labs & imaging results that were available during my care of the patient were reviewed by me and considered in my medical decision making (see chart for details).  On exam, patient with bilateral middle ear effusions, consistent with bilateral eustachian tube dysfunction.  Decadron 10 mg IM administered for eustachian tube inflammation.  Will start patient on prednisone 40 mg for the next 5 days along with cetirizine D 5-120 mg tablets.  Supportive care recommendations were provided and discussed with the patient to include use of Flonase  that he has at home, over-the-counter analgesics, and applying warm compresses to the ears.  Patient was advised regarding follow-up.  Patient was in agreement with this plan of care and verbalizes understanding.  All questions were answered.  Patient stable for discharge.  Final Clinical Impressions(s) / UC Diagnoses   Final diagnoses:  None   Discharge Instructions   None    ED Prescriptions   None    PDMP not reviewed this encounter.   Gilmer Etta PARAS, NP 06/30/24 1419

## 2024-10-20 ENCOUNTER — Other Ambulatory Visit: Payer: Self-pay

## 2024-10-20 ENCOUNTER — Ambulatory Visit
Admission: RE | Admit: 2024-10-20 | Discharge: 2024-10-20 | Disposition: A | Discharge status: Home / Self Care | Payer: PRIVATE HEALTH INSURANCE | Source: Ambulatory Visit | Attending: Nurse Practitioner | Admitting: Nurse Practitioner

## 2024-10-20 VITALS — BP 112/82 | HR 77 | Temp 98.3°F | Resp 20

## 2024-10-20 DIAGNOSIS — I10 Essential (primary) hypertension: Secondary | ICD-10-CM

## 2024-10-20 DIAGNOSIS — Z760 Encounter for issue of repeat prescription: Secondary | ICD-10-CM

## 2024-10-20 DIAGNOSIS — E785 Hyperlipidemia, unspecified: Secondary | ICD-10-CM | POA: Diagnosis not present

## 2024-10-20 MED ORDER — AMLODIPINE BESYLATE 10 MG PO TABS: 10.0000 mg | ORAL_TABLET | Freq: Every day | ORAL | 1 refills | Status: AC

## 2024-10-20 MED ORDER — ROSUVASTATIN CALCIUM 20 MG PO TABS: 20.0000 mg | ORAL_TABLET | Freq: Every day | ORAL | 1 refills | Status: AC

## 2024-10-20 MED ORDER — LOSARTAN POTASSIUM 50 MG PO TABS: 50.0000 mg | ORAL_TABLET | Freq: Every day | ORAL | 1 refills | Status: AC

## 2024-10-20 NOTE — ED Provider Notes (Signed)
 [facility]    CSN: [id number] Arrival date & time: 10/20/24  1151      History   Chief Complaint Chief Complaint  Patient presents with   Medication Refill    PCP retired & need meds refilled - Entered by patient    HPI Howard Conner is a 58 y.o. male.   Patient presents today for medication refill.  Reports he takes rosuvastatin 20 mg daily, losartan  50 mg daily, and amlodipine 10 mg daily for high cholesterol and hypertension, respectively.  He reports he is not currently out of the medication but is getting close to running out and his primary care provider recently abruptly retired.  He has a new patient appointment in February and is requesting refills until then.  Reports he has been stable on these medications for many years.  He denies any new muscle aches or joint pains, changes in skin color, chest pain, shortness of breath, dizziness or lightheadedness, vision changes, and lower extremity swelling.    Past Medical History:  Diagnosis Date   Hyperlipidemia    Hypertension    Kidney stone     Patient Active Problem List   Diagnosis Date Noted   Essential hypertension 01/20/2017   Hyperlipidemia 01/20/2017   Chest pain 01/20/2017    Past Surgical History:  Procedure Laterality Date   COLONOSCOPY WITH PROPOFOL  N/A 04/29/2022   Procedure: COLONOSCOPY WITH PROPOFOL ;  Surgeon: Shaaron Lamar HERO, MD;  Location: [facility];  Service: Endoscopy;  Laterality: N/A;  10:45 / ASA 2   KIDNEY STONE SURGERY     POLYPECTOMY  04/29/2022   Procedure: POLYPECTOMY;  Surgeon: Shaaron Lamar HERO, MD;  Location: [facility];  Service: Endoscopy;;   VASECTOMY         Home Medications    Prior to Admission medications   Medication Sig Start Date End Date Taking? Authorizing Provider  amLODipine (NORVASC) 10 MG tablet Take 1 tablet (10 mg total) by mouth daily. 10/20/24   Chandra Harlene LABOR, NP  azelastine  (ASTELIN ) 0.1 % nasal spray Place 1 spray into both  nostrils 2 (two) times daily. Use in each nostril as directed 06/19/24   Stuart Vernell Norris, PA-C  cetirizine  (ZYRTEC ) 10 MG tablet Take 10 mg by mouth daily as needed for allergies.    [provider]  Coenzyme Q10 (COQ-10) 100 MG CAPS Take 100 mg by mouth daily.    [provider]  fluticasone  (FLONASE ) 50 MCG/ACT nasal spray Place 2 sprays into both nostrils daily. 11/30/22   Leath-Warren, Etta PARAS, NP  ibuprofen (ADVIL,MOTRIN) 200 MG tablet Take 400 mg by mouth as needed for moderate pain or headache.    [provider]  losartan  (COZAAR ) 50 MG tablet Take 1 tablet (50 mg total) by mouth daily. 10/20/24   Chandra Harlene LABOR, NP  Omega-3 Fatty Acids (FISH OIL PO) Take 1 capsule by mouth daily.    [provider]  rosuvastatin (CRESTOR) 20 MG tablet Take 1 tablet (20 mg total) by mouth daily. 10/20/24   Chandra Harlene LABOR, NP    Family History Family History  Problem Relation Age of Onset   Lymphoma Father    CAD Maternal Grandfather    CAD Paternal Grandfather     Social History Social History   Tobacco Use   Smoking status: Never   Smokeless tobacco: Never  Vaping Use   Vaping status: Never Used  Substance Use Topics   Alcohol use: No   Drug  use: No     Allergies   Patient has no known allergies.   Review of Systems Review of Systems Per HPI  Physical Exam Triage Vital Signs ED Triage Vitals  Encounter Vitals Group     BP 10/20/24 1205 112/82     Girls Systolic BP Percentile --      Girls Diastolic BP Percentile --      Boys Systolic BP Percentile --      Boys Diastolic BP Percentile --      Pulse Rate 10/20/24 1205 77     Resp 10/20/24 1205 20     Temp 10/20/24 1205 98.3 F (36.8 C)     Temp Source 10/20/24 1205 Oral     SpO2 10/20/24 1205 94 %     Weight --      Height --      Head Circumference --      Peak Flow --      Pain Score 10/20/24 1203 0     Pain Loc --      Pain Education --      Exclude from Growth  Chart --    No data found.  Updated Vital Signs BP 112/82 (BP Location: Right Arm)   Pulse 77   Temp 98.3 F (36.8 C) (Oral)   Resp 20   SpO2 94%   Visual Acuity Right Eye Distance:   Left Eye Distance:   Bilateral Distance:    Right Eye Near:   Left Eye Near:    Bilateral Near:     Physical Exam Vitals and nursing note reviewed.  Constitutional:      General: He is not in acute distress.    Appearance: Normal appearance.  HENT:     Right Ear: External ear normal.     Left Ear: External ear normal.     Mouth/Throat:     Mouth: Mucous membranes are moist.     Pharynx: Oropharynx is clear.  Eyes:     General: No scleral icterus.    Extraocular Movements: Extraocular movements intact.  Neck:     Vascular: No carotid bruit.  Cardiovascular:     Rate and Rhythm: Normal rate.     Heart sounds: Normal heart sounds. No murmur heard. Pulmonary:     Effort: Pulmonary effort is normal. No respiratory distress.     Breath sounds: Normal breath sounds. No wheezing or rhonchi.  Musculoskeletal:        General: Normal range of motion.     Cervical back: Normal range of motion.     Right lower leg: No edema.     Left lower leg: No edema.  Skin:    General: Skin is warm and dry.     Coloration: Skin is not jaundiced or pale.  Neurological:     General: No focal deficit present.     Mental Status: He is alert and oriented to person, place, and time.     Motor: No weakness.     Gait: Gait normal.  Psychiatric:        Mood and Affect: Mood normal.        Behavior: Behavior normal.        Thought Content: Thought content normal.        Judgment: Judgment normal.      UC Treatments / Results  Labs (all labs ordered are listed, but only abnormal results are displayed) Labs Reviewed - No data to display  EKG   Radiology No  results found.  Procedures Procedures (including critical care time)  Medications Ordered in UC Medications - No data to display  Initial  Impression / Assessment and Plan / UC Course  I have reviewed the triage vital signs and the nursing notes.  Pertinent labs & imaging results that were available during my care of the patient were reviewed by me and considered in my medical decision making (see chart for details).   Patient is well-appearing, normotensive, afebrile, not tachycardic, not tachypneic, oxygenating well on room air.   1. Encounter for medication refill 2. Essential hypertension 3. Hyperlipidemia, unspecified hyperlipidemia type Medications refilled for patient -74-month supply given as he has been stable on these medicines for some time now Follow-up with PCP as planned; urgent care in interim with any acute concerns  The patient was given the opportunity to ask questions.  All questions answered to their satisfaction.  The patient is in agreement to this plan.   Final Clinical Impressions(s) / UC Diagnoses   Final diagnoses:  Encounter for medication refill  Essential hypertension  Hyperlipidemia, unspecified hyperlipidemia type     Discharge Instructions      I sent refills of your medicine today. Continue them as prescribed.  Seek care if you develop chest pain or shortness of breath.     ED Prescriptions     Medication Sig Dispense Auth. Provider   amLODipine (NORVASC) 10 MG tablet Take 1 tablet (10 mg total) by mouth daily. 90 tablet Chandra Raisin A, NP   losartan  (COZAAR ) 50 MG tablet Take 1 tablet (50 mg total) by mouth daily. 90 tablet Chandra Raisin A, NP   rosuvastatin (CRESTOR) 20 MG tablet Take 1 tablet (20 mg total) by mouth daily. 90 tablet Chandra Raisin LABOR, NP      PDMP not reviewed this encounter.   Chandra Raisin LABOR, NP 10/20/24 [phone]

## 2024-10-20 NOTE — ED Triage Notes (Signed)
 Pt reports is in between pcp and reports has appointment scheduled in February but daily medications are running low and currently px will run out before February appointment.  Pt inquiring about refills on losartan , rosuvastatin, and amlodipine.

## 2024-10-20 NOTE — Discharge Instructions (Addendum)
 I sent refills of your medicine today. Continue them as prescribed.  Seek care if you develop chest pain or shortness of breath.

## 2025-02-07 ENCOUNTER — Ambulatory Visit: Payer: PRIVATE HEALTH INSURANCE | Admitting: Physician Assistant
